# Patient Record
Sex: Male | Born: 1990 | Race: Black or African American | Hispanic: No | Marital: Single | Smoking: Current every day smoker
Health system: Southern US, Community
[De-identification: ages and names within clinical notes are randomized; demographics above are authoritative.]

## PROBLEM LIST (undated history)

## (undated) ENCOUNTER — Emergency Department (HOSPITAL_COMMUNITY): Admission: EM | Payer: BLUE CROSS/BLUE SHIELD | Source: Home / Self Care

---
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
## 2010-01-23 ENCOUNTER — Emergency Department (HOSPITAL_COMMUNITY)
Admission: EM | Admit: 2010-01-23 | Discharge: 2010-01-24 | Payer: Self-pay | Source: Home / Self Care | Admitting: Emergency Medicine

## 2010-02-26 ENCOUNTER — Emergency Department (HOSPITAL_COMMUNITY)
Admission: EM | Admit: 2010-02-26 | Discharge: 2010-02-26 | Payer: Self-pay | Source: Home / Self Care | Admitting: Family Medicine

## 2010-05-09 LAB — POCT I-STAT, CHEM 8
Glucose, Bld: 84 mg/dL (ref 70–99)
HCT: 46 % (ref 39.0–52.0)
Hemoglobin: 15.6 g/dL (ref 13.0–17.0)
Potassium: 3.8 mEq/L (ref 3.5–5.1)
Sodium: 141 mEq/L (ref 135–145)
TCO2: 27 mmol/L (ref 0–100)

## 2011-11-02 ENCOUNTER — Emergency Department (HOSPITAL_COMMUNITY)
Admission: EM | Admit: 2011-11-02 | Discharge: 2011-11-02 | Disposition: A | Discharge status: Home / Self Care | Payer: Managed Care, Other (non HMO) | Source: Home / Self Care

## 2011-11-02 ENCOUNTER — Encounter (HOSPITAL_COMMUNITY): Payer: Self-pay | Admitting: *Deleted

## 2011-11-02 DIAGNOSIS — L089 Local infection of the skin and subcutaneous tissue, unspecified: Secondary | ICD-10-CM

## 2011-11-02 MED ORDER — CEPHALEXIN 500 MG PO CAPS: 500.0000 mg | ORAL_CAPSULE | Freq: Three times a day (TID) | ORAL | Status: AC

## 2011-11-02 NOTE — ED Provider Notes (Signed)
Medical screening examination/treatment/procedure(s) were performed by non-physician practitioner and as supervising physician I was immediately available for consultation/collaboration.  Leslee Home, M.D.   Reuben Likes, MD 11/02/11 [phone]

## 2011-11-02 NOTE — ED Provider Notes (Signed)
History     CSN: [id number]  Arrival date & time 11/02/11  1716   None     Chief Complaint  Patient presents with  . Abscess    (Consider location/radiation/quality/duration/timing/severity/associated sxs/prior treatment) HPI Comments: Thinks he has a spider bite on shaft of penis. Did not see a spider or insect. Felt it this mid AM.  There is a pinpoint papule or pustule with minimal swelling, total area 3mm.   Patient is a 21 y.o. male presenting with abscess.  Abscess     History reviewed. No pertinent past medical history.  History reviewed. No pertinent past surgical history.  History reviewed. No pertinent family history.  History  Substance Use Topics  . Smoking status: Current Everyday Smoker  . Smokeless tobacco: Not on file  . Alcohol Use: Yes      Review of Systems  Constitutional: Negative.   Respiratory: Negative.   Genitourinary: Negative for dysuria, hematuria, discharge, difficulty urinating, penile pain and testicular pain.  Musculoskeletal: Negative.   Skin: Negative for wound.       Pustule at base of penis.     Allergies  Review of patient's allergies indicates no known allergies.  Home Medications   Current Outpatient Rx  Name Route Sig Dispense Refill  . CEPHALEXIN 500 MG PO CAPS Oral Take 1 capsule (500 mg total) by mouth 3 (three) times daily. 15 capsule 0    BP 118/73  Pulse 51  Temp 98.6 F (37 C) (Oral)  Resp 14  SpO2 100%  Physical Exam  Constitutional: He is oriented to person, place, and time. He appears well-developed and well-nourished.  Pulmonary/Chest: Effort normal.  Genitourinary: Penile tenderness present.  Musculoskeletal: Normal range of motion.  Neurological: He is alert and oriented to person, place, and time. No cranial nerve deficit.  Skin: Skin is warm and dry. No erythema.       Pustule base of wound, similar in appearance to a solitary follicular infection.   Psychiatric: He has a normal mood and  affect.    ED Course  Procedures (including critical care time)  Labs Reviewed - No data to display No results found.   1. Skin pustule       MDM  Reassurance, Keflex 500 tid for 5 d Warm compresses tid.           Hayden Rasmussen, NP 11/02/11 [phone]

## 2011-11-02 NOTE — ED Notes (Signed)
Pt with red spot base of penis onset this am - pt believes a spider bite sore to touch

## 2012-12-24 ENCOUNTER — Emergency Department (INDEPENDENT_AMBULATORY_CARE_PROVIDER_SITE_OTHER)
Admission: EM | Admit: 2012-12-24 | Discharge: 2012-12-24 | Disposition: A | Discharge status: Home / Self Care | Payer: Managed Care, Other (non HMO) | Source: Home / Self Care

## 2012-12-24 ENCOUNTER — Encounter (HOSPITAL_COMMUNITY): Payer: Self-pay | Admitting: Emergency Medicine

## 2012-12-24 DIAGNOSIS — L02215 Cutaneous abscess of perineum: Secondary | ICD-10-CM

## 2012-12-24 DIAGNOSIS — L02219 Cutaneous abscess of trunk, unspecified: Secondary | ICD-10-CM

## 2012-12-24 MED ORDER — SULFAMETHOXAZOLE-TRIMETHOPRIM 800-160 MG PO TABS: 1.0000 | ORAL_TABLET | Freq: Two times a day (BID) | ORAL | Status: DC

## 2012-12-24 MED ORDER — HYDROCODONE-ACETAMINOPHEN 5-325 MG PO TABS: 1.0000 | ORAL_TABLET | ORAL | Status: DC | PRN

## 2012-12-24 NOTE — ED Provider Notes (Signed)
CSN: [id number]     Arrival date & time 12/24/12  0802 History   None    Chief Complaint  Patient presents with  . Lymphadenopathy   (Consider location/radiation/quality/duration/timing/severity/associated sxs/prior Treatment) HPI Comments: C/O swollen lymph node in groin for 1 week. "Started like a pimple and got bigger" Denies urinary or genital sx's associated with the painful lesion.   History reviewed. No pertinent past medical history. History reviewed. No pertinent past surgical history. History reviewed. No pertinent family history. History  Substance Use Topics  . Smoking status: Current Every Day Smoker  . Smokeless tobacco: Not on file  . Alcohol Use: Yes    Review of Systems  Constitutional: Positive for activity change. Negative for fever and fatigue.  Gastrointestinal: Negative.   Genitourinary: Positive for genital sores. Negative for dysuria, urgency, frequency, flank pain, discharge, penile swelling, scrotal swelling, penile pain and testicular pain.  Skin: Positive for wound.  All other systems reviewed and are negative.    Allergies  Review of patient's allergies indicates no known allergies.  Home Medications   Current Outpatient Rx  Name  Route  Sig  Dispense  Refill  . HYDROcodone-acetaminophen (NORCO/VICODIN) 5-325 MG per tablet   Oral   Take 1 tablet by mouth every 4 (four) hours as needed for pain.   15 tablet   0   . sulfamethoxazole-trimethoprim (SEPTRA DS) 800-160 MG per tablet   Oral   Take 1 tablet by mouth 2 (two) times daily. X 7 days   14 tablet   0    There were no vitals taken for this visit. Physical Exam  Nursing note and vitals reviewed. Constitutional: He is oriented to person, place, and time. He appears well-developed and well-nourished. No distress.  Neck: Normal range of motion. Neck supple.  Cardiovascular: Normal rate.   Pulmonary/Chest: Effort normal. No respiratory distress.  Genitourinary: No penile tenderness.   Neurological: He is alert and oriented to person, place, and time. He exhibits normal muscle tone.  Skin: Skin is warm and dry.  Elongated painful, tender fluctuant abscess along the right side of the perineum but no involvement of the scrotum or rectum.   Psychiatric: He has a normal mood and affect.    ED Course  INCISION AND DRAINAGE Date/Time: 12/24/2012 8:45 AM Performed by: Phineas Real, [place] Kestner Authorized by: Phineas Real, Taquisha Phung Consent: Verbal consent obtained. Risks and benefits: risks, benefits and alternatives were discussed Consent given by: patient Patient understanding: patient states understanding of the procedure being performed Patient identity confirmed: verbally with patient Type: abscess Body area: anogenital Location details: perineum Anesthesia: local infiltration Local anesthetic: lidocaine 2% with epinephrine Anesthetic total: 4 ml Patient sedated: no Scalpel size: 11 Incision type: single straight Complexity: simple Drainage: purulent Drainage amount: copious Wound treatment: drain placed Packing material: 1/4 in gauze Patient tolerance: Patient tolerated the procedure well with no immediate complications.   (including critical care time) Labs Review Labs Reviewed  CULTURE, ROUTINE-ABSCESS   Imaging Review No results found.    MDM   1. Abscess of perineum      Warm compresses Norco 5 q 4h prn pain #15 Septra DS bid X 7 days. Culture obtained  Hayden Rasmussen, NP 12/24/12 [phone]

## 2012-12-24 NOTE — ED Notes (Signed)
Pt    Reports      Swelling of  Lymph  Node  In  Groin  Area      He  Reports   That   The  Symptoms  Have  Been there  Since last   Week      He reports  Pain  / tenderness in the  Affected  Area           He   denys any       Discharge  Or  Any penile  Lesions  He ambulated  To room  With  A  Steady  Fluid  Gait

## 2012-12-26 ENCOUNTER — Encounter (HOSPITAL_COMMUNITY): Payer: Self-pay | Admitting: Emergency Medicine

## 2012-12-26 ENCOUNTER — Emergency Department (INDEPENDENT_AMBULATORY_CARE_PROVIDER_SITE_OTHER)
Admission: EM | Admit: 2012-12-26 | Discharge: 2012-12-26 | Disposition: A | Discharge status: Home / Self Care | Payer: Managed Care, Other (non HMO) | Source: Home / Self Care | Attending: Family Medicine | Admitting: Family Medicine

## 2012-12-26 DIAGNOSIS — Z4800 Encounter for change or removal of nonsurgical wound dressing: Secondary | ICD-10-CM

## 2012-12-26 MED ORDER — KETOROLAC TROMETHAMINE 10 MG PO TABS: 10.0000 mg | ORAL_TABLET | Freq: Four times a day (QID) | ORAL | Status: DC | PRN

## 2012-12-26 NOTE — ED Notes (Signed)
Presents for f/u of perineal abscess; had I&D with packing done 10/30.  Has been taking Septra DS as directed.  States the "pain pill isn't working so I need something stronger".

## 2012-12-26 NOTE — ED Provider Notes (Signed)
CSN: [id number]     Arrival date & time 12/26/12  1614 History   First MD Initiated Contact with Patient 12/26/12 1650     Chief Complaint  Patient presents with  . Follow-up   (Consider location/radiation/quality/duration/timing/severity/associated sxs/prior Treatment) Patient is a 22 y.o. male presenting with wound check. The history is provided by the patient.  Wound Check This is a new problem. The current episode started 2 days ago. The problem has been gradually improving. Associated symptoms comments: Perineal abscess i+d on 10/30, here for recheck, still with pain, is taking abx..    History reviewed. No pertinent past medical history. History reviewed. No pertinent past surgical history. No family history on file. History  Substance Use Topics  . Smoking status: Current Every Day Smoker  . Smokeless tobacco: Not on file  . Alcohol Use: Yes    Review of Systems  Skin: Positive for wound.    Allergies  Review of patient's allergies indicates no known allergies.  Home Medications   Current Outpatient Rx  Name  Route  Sig  Dispense  Refill  . HYDROcodone-acetaminophen (NORCO/VICODIN) 5-325 MG per tablet   Oral   Take 1 tablet by mouth every 4 (four) hours as needed for pain.   15 tablet   0   . sulfamethoxazole-trimethoprim (SEPTRA DS) 800-160 MG per tablet   Oral   Take 1 tablet by mouth 2 (two) times daily. X 7 days   14 tablet   0   . ketorolac (TORADOL) 10 MG tablet   Oral   Take 1 tablet (10 mg total) by mouth every 6 (six) hours as needed for pain.   20 tablet   0    BP 118/71  Pulse 59  Temp(Src) 97.3 F (36.3 C) (Oral)  Resp 16  SpO2 98% Physical Exam  Nursing note and vitals reviewed. Constitutional: He is oriented to person, place, and time. He appears well-developed and well-nourished.  Neurological: He is alert and oriented to person, place, and time.  Skin: Skin is warm and dry.  Perineal abscess packing removed, no drainage, min  tender , no adenopathy.    ED Course  Procedures (including critical care time) Labs Review Labs Reviewed - No data to display Imaging Review No results found.    MDM      Linna Hoff, MD 12/26/12 [phone]

## 2012-12-27 LAB — CULTURE, ROUTINE-ABSCESS

## 2012-12-27 NOTE — ED Provider Notes (Signed)
Medical screening examination/treatment/procedure(s) were performed by a resident physician or non-physician practitioner and as the supervising physician I was immediately available for consultation/collaboration.  Joleah Kosak, MD    Trenesha Alcaide S Donnica Jarnagin, MD 12/27/12 0858 

## 2012-12-29 NOTE — ED Notes (Addendum)
Abscess culture Perineum: Rare bacillus species.  Pt. had I and D done.  Message sent to Hayden Rasmussen NP and Dr. Denyse Amass. Jordan Blanchard 12/29/2012 Hayden Rasmussen NP,  Rrsponded no further action needed. 12/30/2012

## 2013-02-20 ENCOUNTER — Encounter (HOSPITAL_COMMUNITY): Payer: Self-pay | Admitting: Emergency Medicine

## 2013-02-20 ENCOUNTER — Emergency Department (INDEPENDENT_AMBULATORY_CARE_PROVIDER_SITE_OTHER)
Admission: EM | Admit: 2013-02-20 | Discharge: 2013-02-20 | Disposition: A | Discharge status: Home / Self Care | Payer: Managed Care, Other (non HMO) | Source: Home / Self Care | Attending: Emergency Medicine | Admitting: Emergency Medicine

## 2013-02-20 DIAGNOSIS — R030 Elevated blood-pressure reading, without diagnosis of hypertension: Secondary | ICD-10-CM

## 2013-02-20 DIAGNOSIS — L02214 Cutaneous abscess of groin: Secondary | ICD-10-CM

## 2013-02-20 DIAGNOSIS — L02219 Cutaneous abscess of trunk, unspecified: Secondary | ICD-10-CM

## 2013-02-20 NOTE — ED Provider Notes (Signed)
CSN: [id number]     Arrival date & time 02/20/13  0932 History   First MD Initiated Contact with Patient 02/20/13 1037     Chief Complaint  Patient presents with  . Abscess   (Consider location/radiation/quality/duration/timing/severity/associated sxs/prior Treatment) Patient is a 22 y.o. male presenting with abscess. The history is provided by the patient.  Abscess Location:  Ano-genital Ano-genital abscess location:  Groin Abscess quality: painful   Red streaking: no   Duration:  5 days Progression:  Worsening Pain details:    Quality:  Aching   Severity:  Mild   Duration:  4 days Context: not diabetes and not immunosuppression   Risk factors: prior abscess   Risk factors: no hx of MRSA     History reviewed. No pertinent past medical history. History reviewed. No pertinent past surgical history. No family history on file. History  Substance Use Topics  . Smoking status: Current Every Day Smoker  . Smokeless tobacco: Not on file  . Alcohol Use: Yes    Review of Systems  All other systems reviewed and are negative.    Allergies  Review of patient's allergies indicates no known allergies.  Home Medications   Current Outpatient Rx  Name  Route  Sig  Dispense  Refill  . HYDROcodone-acetaminophen (NORCO/VICODIN) 5-325 MG per tablet   Oral   Take 1 tablet by mouth every 4 (four) hours as needed for pain.   15 tablet   0   . ketorolac (TORADOL) 10 MG tablet   Oral   Take 1 tablet (10 mg total) by mouth every 6 (six) hours as needed for pain.   20 tablet   0   . sulfamethoxazole-trimethoprim (SEPTRA DS) 800-160 MG per tablet   Oral   Take 1 tablet by mouth 2 (two) times daily. X 7 days   14 tablet   0    BP 153/83  Pulse 76  Temp(Src) 98.8 F (37.1 C) (Oral)  Resp 18  SpO2 100% Physical Exam  Vitals reviewed. Constitutional: He is oriented to person, place, and time. He appears well-developed and well-nourished. No distress.  HENT:  Head:  Normocephalic and atraumatic.  Cardiovascular: Normal rate.   Pulmonary/Chest: Effort normal.  Abdominal: There is no tenderness.  Genitourinary: Testes normal and penis normal. Circumcised. No penile tenderness.  3x3cm fluctuant abscess underneath scrotum in right groin fold (perineum) area.  Musculoskeletal: Normal range of motion.  Neurological: He is alert and oriented to person, place, and time.  Skin: Skin is warm and dry.  Psychiatric: He has a normal mood and affect. His behavior is normal.    ED Course  INCISION AND DRAINAGE Date/Time: 02/20/2013 11:16 AM Performed by: Lemmie Evens Vandruff Authorized by: Leslee Home C Consent: Verbal consent obtained. Risks and benefits: risks, benefits and alternatives were discussed Consent given by: patient Patient understanding: patient states understanding of the procedure being performed Patient consent: the patient's understanding of the procedure matches consent given Patient identity confirmed: verbally with patient Time out: Immediately prior to procedure a "time out" was called to verify the correct patient, procedure, equipment, support staff and site/side marked as required. Type: abscess Body area: anogenital Location details: perineum Local anesthetic: topical anesthetic Patient sedated: no Scalpel size: 11 Incision type: single straight Complexity: simple Drainage: purulent Drainage amount: moderate Wound treatment: wound left open Packing material: none Patient tolerance: Patient tolerated the procedure well with no immediate complications.   (including critical care time) Labs Review Labs Reviewed - No data  to display Imaging Review No results found.  EKG Interpretation    Date/Time:    Ventricular Rate:    PR Interval:    QRS Duration:   QT Interval:    QTC Calculation:   R Axis:     Text Interpretation:              MDM   I&D performed. Counseled patient about wound care at home.     Jordan Blanchard, [place] 02/20/13 1118

## 2013-02-20 NOTE — ED Notes (Signed)
Pt c/o perineal abscess onset 3 days... Reports he was seen here on 12/24/12 for the same reason He was given Septra; finished complete course and tolerated well... sxs today include: swelling and pain w/activity Denies: drainage, f/v/n/d He is alert w/no signs of acute distress.

## 2013-02-20 NOTE — ED Provider Notes (Signed)
Medical screening examination/treatment/procedure(s) were performed by non-physician practitioner and as supervising physician I was immediately available for consultation/collaboration.  Leslee Home, M.D.  Reuben Likes, MD 02/20/13 [phone]

## 2013-09-05 ENCOUNTER — Encounter (HOSPITAL_COMMUNITY): Payer: Self-pay | Admitting: Emergency Medicine

## 2013-09-05 ENCOUNTER — Emergency Department (HOSPITAL_COMMUNITY)
Admission: EM | Admit: 2013-09-05 | Discharge: 2013-09-05 | Disposition: A | Discharge status: Home / Self Care | Payer: Managed Care, Other (non HMO) | Attending: Emergency Medicine | Admitting: Emergency Medicine

## 2013-09-05 DIAGNOSIS — K089 Disorder of teeth and supporting structures, unspecified: Secondary | ICD-10-CM | POA: Insufficient documentation

## 2013-09-05 DIAGNOSIS — M2689 Other dentofacial anomalies: Secondary | ICD-10-CM | POA: Insufficient documentation

## 2013-09-05 DIAGNOSIS — Z79899 Other long term (current) drug therapy: Secondary | ICD-10-CM | POA: Insufficient documentation

## 2013-09-05 DIAGNOSIS — K011 Impacted teeth: Secondary | ICD-10-CM

## 2013-09-05 DIAGNOSIS — K0889 Other specified disorders of teeth and supporting structures: Secondary | ICD-10-CM

## 2013-09-05 DIAGNOSIS — F17200 Nicotine dependence, unspecified, uncomplicated: Secondary | ICD-10-CM | POA: Insufficient documentation

## 2013-09-05 MED ORDER — AMOXICILLIN 500 MG PO CAPS: 500.0000 mg | ORAL_CAPSULE | Freq: Once | ORAL | Status: DC

## 2013-09-05 MED ORDER — HYDROCODONE-ACETAMINOPHEN 5-325 MG PO TABS: 1.0000 | ORAL_TABLET | ORAL | Status: DC | PRN

## 2013-09-05 MED ORDER — AMOXICILLIN 500 MG PO CAPS: 500.0000 mg | ORAL_CAPSULE | Freq: Three times a day (TID) | ORAL | Status: DC

## 2013-09-05 MED ORDER — HYDROCODONE-ACETAMINOPHEN 5-325 MG PO TABS: 1.0000 | ORAL_TABLET | Freq: Once | ORAL | Status: DC

## 2013-09-05 NOTE — ED Notes (Signed)
Patient here with complaint of left lower wisdom tooth gingiva swelling and pain. States that it started yesterday and has gradually gotten worse. Presents tonight because pain is worse and a friend told him "those things can kill you".

## 2013-09-05 NOTE — ED Provider Notes (Signed)
CSN: [id number]     Arrival date & time 09/05/13  2251 History   First MD Initiated Contact with Patient 09/05/13 2305     Chief Complaint  Patient presents with  . Dental Pain   HPI  History provided by the patient. Patient is a 23 year old male who presents with complaints of pain and swelling around his left lower wisdom tooth. He states he is concerned for a dental abscess. States he's had some pain and tenderness to the gums and that was red and bleeding with some small amounts of pus. This is been going on for the past few days and gradually worsening. He is concerned for worsening infection. He denies any associated fever, chills or sweats. Denies any swelling under the tongue. Denies similar symptoms previously. He did take some over-the-counter pain medications which seem to help well for the pain. No other aggravating or alleviating factors. No other associated symptoms.   No past medical history on file. No past surgical history on file. No family history on file. History  Substance Use Topics  . Smoking status: Current Every Day Smoker -- 0.10 packs/day    Types: Cigarettes  . Smokeless tobacco: Not on file  . Alcohol Use: Yes    Review of Systems  Constitutional: Negative for fever, chills and diaphoresis.  HENT: Positive for dental problem.   All other systems reviewed and are negative.     Allergies  Review of patient's allergies indicates no known allergies.  Home Medications   Prior to Admission medications   Medication Sig Start Date End Date Taking? Authorizing Provider  HYDROcodone-acetaminophen (NORCO/VICODIN) 5-325 MG per tablet Take 1 tablet by mouth every 4 (four) hours as needed for pain. 12/24/12   Hayden Rasmussenavid Mabe, NP  ketorolac (TORADOL) 10 MG tablet Take 1 tablet (10 mg total) by mouth every 6 (six) hours as needed for pain. 12/26/12   Linna HoffJames D Kindl, MD  sulfamethoxazole-trimethoprim (SEPTRA DS) 800-160 MG per tablet Take 1 tablet by mouth 2 (two) times  daily. X 7 days 12/24/12   Hayden Rasmussenavid Mabe, NP   BP 136/74  Pulse 64  Temp(Src) 98 F (36.7 C) (Oral)  Resp 20  Ht 5\' 11"  (1.803 m)  Wt 165 lb (74.844 kg)  BMI 23.02 kg/m2  SpO2 97% Physical Exam  Nursing note and vitals reviewed. Constitutional: He is oriented to person, place, and time. He appears well-developed and well-nourished.  HENT:  Head: Normocephalic and atraumatic.  Mouth/Throat:    Partially impacted left lower third molar. There is redness and swelling of the gingiva around the tooth. No bleeding or discharge.  Neck: Normal range of motion. Neck supple.  Cardiovascular: Normal rate and regular rhythm.   Pulmonary/Chest: Effort normal and breath sounds normal. No respiratory distress. He has no wheezes.  Abdominal: Soft.  Lymphadenopathy:    He has no cervical adenopathy.  Neurological: He is alert and oriented to person, place, and time.  Skin: Skin is warm.  Psychiatric: He has a normal mood and affect. His behavior is normal.    ED Course  Procedures   COORDINATION OF CARE:  Nursing notes reviewed. Vital signs reviewed. Initial pt interview and examination performed.   Filed Vitals:   09/05/13 2258 09/05/13 2324  BP: 136/74 114/68  Pulse: 64 64  Temp: 98 F (36.7 C) 98 F (36.7 C)  TempSrc: Oral Oral  Resp: 20 18  Height: 5\' 11"  (1.803 m)   Weight: 165 lb (74.844 kg)   SpO2: 97% 99%  11:31 PM-patient seen and evaluated. There is some inflammation and tenderness around the gingiva of the left lower third molar with impaction. No other swelling or pain of the tongue. No sign concerning for Ludwig angina. At this time will provide dental referral. We'll give antibiotics and pain medicine. Patient agrees with plan.      MDM   Final diagnoses:  Pain, dental  Impacted third molar tooth       Angus Seller, PA-C 09/05/13 2332

## 2013-09-05 NOTE — Discharge Instructions (Signed)
Please follow up with a dentist.   Dental Pain A tooth ache may be caused by cavities (tooth decay). Cavities expose the nerve of the tooth to air and hot or cold temperatures. It may come from an infection or abscess (also called a boil or furuncle) around your tooth. It is also often caused by dental caries (tooth decay). This causes the pain you are having. DIAGNOSIS  Your caregiver can diagnose this problem by exam. TREATMENT   If caused by an infection, it may be treated with medications which kill germs (antibiotics) and pain medications as prescribed by your caregiver. Take medications as directed.  Only take over-the-counter or prescription medicines for pain, discomfort, or fever as directed by your caregiver.  Whether the tooth ache today is caused by infection or dental disease, you should see your dentist as soon as possible for further care. SEEK MEDICAL CARE IF: The exam and treatment you received today has been provided on an emergency basis only. This is not a substitute for complete medical or dental care. If your problem worsens or new problems (symptoms) appear, and you are unable to meet with your dentist, call or return to this location. SEEK IMMEDIATE MEDICAL CARE IF:   You have a fever.  You develop redness and swelling of your face, jaw, or neck.  You are unable to open your mouth.  You have severe pain uncontrolled by pain medicine. MAKE SURE YOU:   Understand these instructions.  Will watch your condition.  Will get help right away if you are not doing well or get worse. Document Released: 02/11/2005 Document Revised: 05/06/2011 Document Reviewed: 09/30/2007 [facility] Patient Information 2015 [place]. This information is not intended to replace advice given to you by your health care provider. Make sure you discuss any questions you have with your health care provider.    Impacted Molar Molars are the teeth in the back of your mouth. You have 12  molars. There are 6 molars in each jaw, 3 on each side. When they grow in (erupt) they sometimes cause problems. Molars trapped inside the gum are impacted molars. Impacted molars may grow sideways, tilted, or may only partially emerge. Molars erupt at different times in life. The first set of molars usually erupts around 626 to 23 years of age. The second set of molars typically erupts around 1211 to 23 years of age. The third set of molars are called wisdom teeth. These molars usually do not have enough space to erupt properly. Many teens and young adults develop impacted wisdom teeth and have them surgically removed (extracted). However, any molar or set of molars may become impacted. CAUSES  Teeth that are crowded are often the reason for an impacted molar, but sometimes a cyst or tumor may cause impaction of molars. SYMPTOMS  Sometimes there are no symptoms and an impacted molar is noticed during an exam or X-ray. If there are symptoms they may include:  Pain.  Swelling, redness, or inflammation near the impacted tooth or teeth.  Stiff jaw.  General feeling of illness.  Bad breath.  Gap between the teeth.  Difficulty opening your mouth.  Headache or jaw ache.  Swollen lymph nodes. Impacted teeth may increase the risk of complications such as:  Infection, with possible drainage around the infected area.  Damage to nearby teeth.  Growth of cysts.  Chronic discomfort. DIAGNOSIS  Impacted molars are diagnosed by oral exam and X-rays. TREATMENT  The goal of treatment is to obtain the  best possible arrangement of your teeth. Your dentist or orthodontist will recommend the best course of action for you. After an exam, your caregiver may recommend one or a combination of the following treatments.  Supportive home care to manage pain and other symptoms until treatment can be started.  Surgical extraction of one or a combination of molars to leave room for emerging or later molars.  Teeth must be extracted at appropriate times for the best results.  Surgical uncovering of tissue covering the impacted molar.  Orthodontic repositioning with the use of appliances such as elastic or metal separators, braces, wires, springs, and other removable or fixed devices. This is done to guide the molar and surrounding teeth to grow in properly. In some cases, you may need some surgery to assist this procedure. Follow-up orthodontic treatment is often necessary with impacted first and second molars.  Antibiotics to treat infection. HOME CARE INSTRUCTIONS Rinse as directed with an antibacterial solution or salt and warm water. Follow up with your caregiver as directed, even if you do not have symptoms. If you are waiting for treatment and have pain:  Take pain medicines as directed.  Take your antibiotics as directed. Finish them even if you start to feel better.  Put ice on the affected area.  Put ice in a plastic bag.  Place a towel between your skin and the bag.  Leave the ice on for 15-20 minutes, 03-04 times a day. SEEK DENTAL CARE IF:  You have a fever.  Pain emerges, worsens, or is not controlled by the medicines you were given.  Swelling occurs.  You have difficulty opening your mouth or swallowing. MAKE SURE YOU:   Understand these instructions.  Will watch your condition.  Will get help right away if you are not doing well or get worse. Document Released: 10/10/2010 Document Revised: 05/06/2011 Document Reviewed: 10/10/2010 [facility] Patient Information 2015 [place], [place]. This information is not intended to replace advice given to you by your health care provider. Make sure you discuss any questions you have with your health care provider.

## 2013-09-06 NOTE — ED Provider Notes (Signed)
Medical screening examination/treatment/procedure(s) were performed by non-physician practitioner and as supervising physician I was immediately available for consultation/collaboration.    Nickia Boesen, MD 09/06/13 0700 

## 2013-09-22 ENCOUNTER — Ambulatory Visit (HOSPITAL_COMMUNITY)
Admission: RE | Admit: 2013-09-22 | Discharge: 2013-09-22 | Disposition: A | Discharge status: Home / Self Care | Payer: Managed Care, Other (non HMO) | Source: Ambulatory Visit | Attending: Chiropractic Medicine | Admitting: Chiropractic Medicine

## 2013-09-22 ENCOUNTER — Other Ambulatory Visit (HOSPITAL_COMMUNITY): Payer: Self-pay | Admitting: Chiropractic Medicine

## 2013-09-22 DIAGNOSIS — R52 Pain, unspecified: Secondary | ICD-10-CM

## 2013-09-22 DIAGNOSIS — M549 Dorsalgia, unspecified: Secondary | ICD-10-CM | POA: Insufficient documentation

## 2014-01-18 ENCOUNTER — Encounter (HOSPITAL_COMMUNITY): Payer: Self-pay

## 2014-01-18 ENCOUNTER — Emergency Department (HOSPITAL_COMMUNITY)
Admission: EM | Admit: 2014-01-18 | Discharge: 2014-01-18 | Disposition: A | Discharge status: Home / Self Care | Payer: Managed Care, Other (non HMO) | Attending: Emergency Medicine | Admitting: Emergency Medicine

## 2014-01-18 DIAGNOSIS — Z792 Long term (current) use of antibiotics: Secondary | ICD-10-CM | POA: Diagnosis not present

## 2014-01-18 DIAGNOSIS — Z79899 Other long term (current) drug therapy: Secondary | ICD-10-CM | POA: Insufficient documentation

## 2014-01-18 DIAGNOSIS — L02215 Cutaneous abscess of perineum: Secondary | ICD-10-CM | POA: Diagnosis not present

## 2014-01-18 DIAGNOSIS — Z720 Tobacco use: Secondary | ICD-10-CM | POA: Diagnosis not present

## 2014-01-18 MED ORDER — CLINDAMYCIN HCL 150 MG PO CAPS: 450.0000 mg | ORAL_CAPSULE | Freq: Three times a day (TID) | ORAL | Status: DC

## 2014-01-18 MED ORDER — CLINDAMYCIN HCL 150 MG PO CAPS
600.0000 mg | ORAL_CAPSULE | Freq: Once | ORAL | Status: AC
  Administered 2014-01-18: 600 mg via ORAL
  Filled 2014-01-18: qty 4

## 2014-01-18 NOTE — Discharge Instructions (Signed)
1. Medications: clindamycin, usual home medications 2. Treatment: rest, drink plenty of fluids, warm water soaks 3. Follow Up: Please followup with your primary doctor in 1 day for discussion of your diagnoses and wound check; if you do not have a primary care doctor use the resource guide provided to find one; Please return to the ER for painful defecation, worsening pain, fevers or chills    Abscess An abscess is an infected area that contains a collection of pus and debris.It can occur in almost any part of the body. An abscess is also known as a furuncle or boil. CAUSES  An abscess occurs when tissue gets infected. This can occur from blockage of oil or sweat glands, infection of hair follicles, or a minor injury to the skin. As the body tries to fight the infection, pus collects in the area and creates pressure under the skin. This pressure causes pain. People with weakened immune systems have difficulty fighting infections and get certain abscesses more often.  SYMPTOMS Usually an abscess develops on the skin and becomes a painful mass that is red, warm, and tender. If the abscess forms under the skin, you may feel a moveable soft area under the skin. Some abscesses break open (rupture) on their own, but most will continue to get worse without care. The infection can spread deeper into the body and eventually into the bloodstream, causing you to feel ill.  DIAGNOSIS  Your caregiver will take your medical history and perform a physical exam. A sample of fluid may also be taken from the abscess to determine what is causing your infection. TREATMENT  Your caregiver may prescribe antibiotic medicines to fight the infection. However, taking antibiotics alone usually does not cure an abscess. Your caregiver may need to make a small cut (incision) in the abscess to drain the pus. In some cases, gauze is packed into the abscess to reduce pain and to continue draining the area. HOME CARE INSTRUCTIONS     Only take over-the-counter or prescription medicines for pain, discomfort, or fever as directed by your caregiver.  If you were prescribed antibiotics, take them as directed. Finish them even if you start to feel better.  If gauze is used, follow your caregiver's directions for changing the gauze.  To avoid spreading the infection:  Keep your draining abscess covered with a bandage.  Wash your hands well.  Do not share personal care items, towels, or whirlpools with others.  Avoid skin contact with others.  Keep your skin and clothes clean around the abscess.  Keep all follow-up appointments as directed by your caregiver. SEEK MEDICAL CARE IF:   You have increased pain, swelling, redness, fluid drainage, or bleeding.  You have muscle aches, chills, or a general ill feeling.  You have a fever. MAKE SURE YOU:   Understand these instructions.  Will watch your condition.  Will get help right away if you are not doing well or get worse. Document Released: 11/21/2004 Document Revised: 08/13/2011 Document Reviewed: 04/26/2011 [facility] Patient Information 2015 [place], [place]. This information is not intended to replace advice given to you by your health care provider. Make sure you discuss any questions you have with your health care provider.

## 2014-01-18 NOTE — ED Notes (Signed)
Pt. Reports having an abscess on his rt. Groin started last week, and it opened and began to drain.  He reports that he believes that he has a swollen lymp node.   Pt. Denies any testicle swelling or penile drainage.

## 2014-01-18 NOTE — ED Provider Notes (Signed)
CSN: [id number]     Arrival date & time 01/18/14  1524 History  This chart was scribed for non-physician practitioner, Dierdre ForthHannah Avarey Yaeger, PA-C, working with Elwin MochaBlair Walden, MD, by Bronson CurbJacqueline Melvin, ED Scribe. This patient was seen in room TR10C/TR10C and the patient's care was started at 4:23 PM.   Chief Complaint  Patient presents with  . Abscess    The history is provided by the patient and medical records. No language interpreter was used.     HPI Comments: Jordan Blanchard is a 23 y.o. male who presents to the Emergency Department complaining of possible abscess to his right groin that appeared last week. Patient reports history of 3 prior abscesses in the same area, and states this current abscess has opened and begun to drain. He also suspects a swollen lymph node in the same area. He notes the abscess has improved since draining. Patient denies fever, chills, nausea, vomiting, penile discharge, penile pain, testicular pain, or pain with BM. He denies history of HIV, steroid use, or IV drug use.  History reviewed. No pertinent past medical history. History reviewed. No pertinent past surgical history. No family history on file. History  Substance Use Topics  . Smoking status: Current Every Day Smoker -- 0.10 packs/day    Types: Cigarettes  . Smokeless tobacco: Not on file  . Alcohol Use: Yes    Review of Systems  Constitutional: Negative for fever and chills.  Gastrointestinal: Negative for nausea and vomiting.  Genitourinary: Negative for discharge, penile pain and testicular pain.  Skin: Positive for rash and wound (abscess).  Allergic/Immunologic: Negative for immunocompromised state.  Hematological: Does not bruise/bleed easily.  Psychiatric/Behavioral: The patient is not nervous/anxious.       Allergies  Review of patient's allergies indicates no known allergies.  Home Medications   Prior to Admission medications   Medication Sig Start Date End Date Taking?  Authorizing Provider  amoxicillin (AMOXIL) 500 MG capsule Take 1 capsule (500 mg total) by mouth 3 (three) times daily. 09/05/13   Phill MutterPeter S Dammen, PA-C  clindamycin (CLEOCIN) 150 MG capsule Take 3 capsules (450 mg total) by mouth 3 (three) times daily. 01/18/14   Sayde Lish, PA-C  HYDROcodone-acetaminophen (NORCO/VICODIN) 5-325 MG per tablet Take 1 tablet by mouth every 4 (four) hours as needed for pain. 12/24/12   Hayden Rasmussenavid Mabe, NP  HYDROcodone-acetaminophen (NORCO/VICODIN) 5-325 MG per tablet Take 1-2 tablets by mouth every 4 (four) hours as needed for moderate pain. 09/05/13   Phill MutterPeter S Dammen, PA-C  ketorolac (TORADOL) 10 MG tablet Take 1 tablet (10 mg total) by mouth every 6 (six) hours as needed for pain. 12/26/12   Linna HoffJames D Kindl, MD  sulfamethoxazole-trimethoprim (SEPTRA DS) 800-160 MG per tablet Take 1 tablet by mouth 2 (two) times daily. X 7 days 12/24/12   Hayden Rasmussenavid Mabe, NP   Triage Vitals: BP 135/71 mmHg  Pulse 108  Temp(Src) 97.6 F (36.4 C)  Resp 16  Ht 6' (1.829 m)  Wt 160 lb (72.576 kg)  BMI 21.70 kg/m2  SpO2 100%  Physical Exam  Constitutional: He is oriented to person, place, and time. He appears well-developed and well-nourished. No distress.  HENT:  Head: Normocephalic and atraumatic.  Eyes: Conjunctivae are normal. No scleral icterus.  Neck: Normal range of motion.  Cardiovascular: Normal rate, regular rhythm, normal heart sounds and intact distal pulses.   No murmur heard. Pulmonary/Chest: Effort normal and breath sounds normal.  Abdominal: Soft. He exhibits no distension. There is no tenderness. Hernia confirmed  negative in the right inguinal area and confirmed negative in the left inguinal area.  Genitourinary: Testes normal and penis normal. Cremasteric reflex is present. Right testis shows no mass, no swelling and no tenderness. Right testis is descended. Cremasteric reflex is not absent on the right side. Left testis shows no mass, no swelling and no tenderness.  Left testis is descended. Cremasteric reflex is not absent on the left side. Circumcised. No phimosis, paraphimosis, hypospadias, penile erythema or penile tenderness. No discharge found.  Mild lymphadenopathy to the right inguinal area Superficial abscess to the perineum; base of the abscess visualized with bedside ultrasound  Lymphadenopathy:    He has no cervical adenopathy.       Right: Inguinal adenopathy present.       Left: No inguinal adenopathy present.  Neurological: He is alert and oriented to person, place, and time.  Skin: Skin is warm and dry. He is not diaphoretic. There is erythema.  Psychiatric: He has a normal mood and affect.  Nursing note and vitals reviewed.   ED Course  INCISION AND DRAINAGE Date/Time: 01/18/2014 5:03 PM Performed by: Dierdre ForthMUTHERSBAUGH, Kache Mcclurg Authorized by: Dierdre ForthMUTHERSBAUGH, Esther Bradstreet Consent: Verbal consent obtained. Risks and benefits: risks, benefits and alternatives were discussed Consent given by: patient Patient understanding: patient states understanding of the procedure being performed Patient consent: the patient's understanding of the procedure matches consent given Procedure consent: procedure consent matches procedure scheduled Relevant documents: relevant documents present and verified Test results: test results available and properly labeled Imaging studies: imaging studies available (bedside US) Required items: required blood products, implants, devices, and special equipment available Patient identity confirmed: verbally with patient and arm band Time out: Immediately prior to procedure a "time out" was called to verify the correct patient, procedure, equipment, support staff and site/side marked as required. Type: abscess Body area: anogenital Location details: perineum Local anesthetic: co-phenylcaine spray Patient sedated: no Scalpel size: 11 Incision type: single straight Complexity: simple Drainage: purulent Drainage amount:  moderate Wound treatment: wound left open Packing material: none Patient tolerance: Patient tolerated the procedure well with no immediate complications   (including critical care time)  DIAGNOSTIC STUDIES: Oxygen Saturation is 100% on room air, normal by my interpretation.    COORDINATION OF CARE: At 1629 Discussed treatment plan with patient which includes I&D. Patient agrees.   EMERGENCY DEPARTMENT US SOFT TISSUE INTERPRETATION "Study: Limited Ultrasound of the noted body part in comments below"  INDICATIONS: Pain Multiple views of the body part are obtained with a multi-frequency linear probe  PERFORMED BY:  Myself  IMAGES ARCHIVED?: Yes  SIDE: Just to the right of Midline  BODY PART: Perineum  FINDINGS: Abcess present  LIMITATIONS:  Body Habitus  INTERPRETATION:  Abcess present and No cellulitis noted  COMMENT:  Small abscess noted on the perineum just to the right of midline; base of the abscess visualized on ultrasound and no surrounding cellulitis     Labs Review Labs Reviewed - No data to display  Imaging Review No results found.   EKG Interpretation None      MDM   Final diagnoses:  Perineal abscess, superficial   Jordan Blanchard is intraperitoneal abscess. Abscess is small and amenable to incision and drainage. Patient without fevers, chills, nausea or vomiting to suggest systemic infection. Patient without painful defecation, rectal pain or tenderness to palpation of other portions of the peritoneal. No tenderness to palpation of the scrotum or penis. No dysuria or hematuria.  Patient with skin abscess amenable to incision and  drainage.  Abscess was not large enough to warrant packing or drain,  wound recheck in 1 days. Encouraged home warm soaks and flushing.  No signs of cellulitis is surrounding skin.  Will d/c to home with clindamycin  I have personally reviewed patient's vitals, nursing note and any pertinent labs or imaging.  I performed an  undressed physical exam.    It has been determined that no acute conditions requiring further emergency intervention are present at this time. The patient/guardian have been advised of the diagnosis and plan. I reviewed all labs and imaging including any potential incidental findings. We have discussed signs and symptoms that warrant return to the ED and they are listed in the discharge instructions.    Vital signs are stable at discharge. Patient with tachycardia at triage but no tachycardia on my physical exam.  BP 132/70 mmHg  Pulse 60  Temp(Src) 98.4 F (36.9 C)  Resp 18  Ht 6' (1.829 m)  Wt 160 lb (72.576 kg)  BMI 21.70 kg/m2  SpO2 100%   I personally performed the services described in this documentation, which was scribed in my presence. The recorded information has been reviewed and is accurate.    Dahlia Client Nieve Rojero, PA-C 01/18/14 1706  Elwin Mocha, MD 01/18/14 [phone]

## 2014-01-25 ENCOUNTER — Emergency Department (HOSPITAL_COMMUNITY)
Admission: EM | Admit: 2014-01-25 | Discharge: 2014-01-25 | Disposition: A | Discharge status: Home / Self Care | Payer: No Typology Code available for payment source

## 2014-01-25 NOTE — ED Notes (Signed)
Pt called for triage no response ?

## 2014-01-25 NOTE — ED Notes (Signed)
Pt called from triage with no response. 

## 2014-04-18 ENCOUNTER — Emergency Department (HOSPITAL_COMMUNITY)
Admission: EM | Admit: 2014-04-18 | Discharge: 2014-04-18 | Disposition: A | Discharge status: Home / Self Care | Payer: BLUE CROSS/BLUE SHIELD | Source: Home / Self Care | Attending: Emergency Medicine | Admitting: Emergency Medicine

## 2014-04-18 ENCOUNTER — Encounter (HOSPITAL_COMMUNITY): Payer: Self-pay | Admitting: Emergency Medicine

## 2014-04-18 DIAGNOSIS — L0291 Cutaneous abscess, unspecified: Secondary | ICD-10-CM

## 2014-04-18 MED ORDER — MUPIROCIN 2 % EX OINT: TOPICAL_OINTMENT | CUTANEOUS | Status: DC

## 2014-04-18 MED ORDER — SULFAMETHOXAZOLE-TRIMETHOPRIM 800-160 MG PO TABS: 2.0000 | ORAL_TABLET | Freq: Two times a day (BID) | ORAL | Status: DC

## 2014-04-18 MED ORDER — HYDROCODONE-ACETAMINOPHEN 5-325 MG PO TABS: ORAL_TABLET | ORAL | Status: DC

## 2014-04-18 MED ORDER — LIDOCAINE-EPINEPHRINE (PF) 2 %-1:200000 IJ SOLN
INTRAMUSCULAR | Status: AC
  Filled 2014-04-18: qty 20

## 2014-04-18 MED ORDER — CHLORHEXIDINE GLUCONATE 4 % EX LIQD: Freq: Every day | CUTANEOUS | Status: DC | PRN

## 2014-04-18 NOTE — ED Provider Notes (Signed)
   Chief Complaint   Abscess   History of Present Illness   Jordan Blanchard is a 24 year old male who's had multiple right groin abscesses in the past. Current episode has been going on for the past 6 days and it drained a small amount of pus but is still very swollen and tender. He denies any fever or chills.  Review of Systems   Other than as noted above, the patient denies any of the following symptoms: Systemic:  No fever, chills or sweats. Skin:  No rash or itching.  PMFSH   Past medical history, family history, social history, meds, and allergies were reviewed.   Physical Examination     Vital signs:  BP 120/73 mmHg  Pulse 59  Temp(Src) 98.1 F (36.7 C) (Oral)  Resp 6  SpO2 99% Skin:  There is a 1.5 cm raised, fluctuant, tender abscess in the right groin area.  There was no crepitus, necrosis, ecchymosis, or herrhagic bullae. Skin exam was otherwise normal.  No rash. Ext:  Distal pulses were full, patient has full ROM of all joints.  Procedure Note:  Verbal informed consent was obtained.  The patient was informed of the risks and benefits of the procedure and understands and accepts.  A time out was called and the identity of the patient and correct procedure was confirmed.   The abscess area described above was prepped with Betadine and alcohol and anesthetized with  Ethyl chloride spray, since the patient declined use of injectable lidocaine.  Using a #11 scalpel blade, a singe straight incision was made into the area of fluctulence, yielding a moderate amount of prurulent drainage.  Routine cultures were obtained.  Blunt dissection was used to break up loculations and the wound cavity was not packed, since the patient declined have this done.  A sterile pressure dressing was applied.    Assessment   The encounter diagnosis was Abscess.  Plan     1.  Meds:  The following meds were prescribed:   Discharge Medication List as of 04/18/2014 11:47 AM    START taking these  medications   Details  chlorhexidine (HIBICLENS) 4 % external liquid Apply topically daily as needed. Use as body wash daily for 1 month, Starting 04/18/2014, Until Discontinued, Normal    !! HYDROcodone-acetaminophen (NORCO/VICODIN) 5-325 MG per tablet 1 to 2 tabs every 4 to 6 hours as needed for pain., Print    mupirocin ointment (BACTROBAN) 2 % Apply to both nostrils TID for 1 month, Normal    !! sulfamethoxazole-trimethoprim (BACTRIM DS,SEPTRA DS) 800-160 MG per tablet Take 2 tablets by mouth 2 (two) times daily., Starting 04/18/2014, Until Discontinued, Normal     !! - Potential duplicate medications found. Please discuss with provider.      2.  Patient Education/Counseling:  The patient was given appropriate handouts, wound care instructions, and instructed in pain control.  He was instructed in a MRSA eradication regimen.  3.  Follow up:  The patient was instructed to return if becoming worse in any way, and given some red flag symptoms such as fever which would prompt immediate return.       Jordan Likesavid C Arvind Mexicano, MD 04/18/14 [phone]

## 2014-04-18 NOTE — ED Notes (Signed)
Pt has an abscess in his right groin.  He states he gets them every couple of months.

## 2014-04-18 NOTE — Discharge Instructions (Signed)
Now that the packing has been removed from your abscess, you may bathe and shower as normal.  You should change the dressing at least once a day.  You may change it more often if it becomes soiled with drainage.  Wash the wound well with soap and water, pat dry, apply an antibiotic ointment (Neosporin, Polysporin, or Bacitracin are all OK), then cover with a non-stick dressing such as Telfa with several layers of plain gause over that to absorb drainage.  You may secure this in place with tape or with roll gauze and tape.  Keep the wound covered for until it stops draining.  This usually takes 7 days, but may be longer.  Return for a recheck if you have heavy bleeding, increasing pain, fever, or the wound looks worse.  ° °

## 2014-04-22 LAB — CULTURE, ROUTINE-ABSCESS

## 2016-03-28 ENCOUNTER — Encounter (HOSPITAL_COMMUNITY): Payer: Self-pay | Admitting: Emergency Medicine

## 2016-03-28 ENCOUNTER — Ambulatory Visit (HOSPITAL_COMMUNITY)
Admission: EM | Admit: 2016-03-28 | Discharge: 2016-03-28 | Disposition: A | Discharge status: Home / Self Care | Payer: BLUE CROSS/BLUE SHIELD | Attending: Family Medicine | Admitting: Family Medicine

## 2016-03-28 DIAGNOSIS — K0889 Other specified disorders of teeth and supporting structures: Secondary | ICD-10-CM

## 2016-03-28 MED ORDER — KETOROLAC TROMETHAMINE 10 MG PO TABS: 10.0000 mg | ORAL_TABLET | Freq: Four times a day (QID) | ORAL | 0 refills | Status: DC | PRN

## 2016-03-28 NOTE — ED Triage Notes (Signed)
Here for dental pain on both sides onset 1 week  Has appt w/dentist next Friday  Taking tyle w/no relief.   A&O x4... NAD

## 2016-03-28 NOTE — Discharge Instructions (Signed)
You have several dental carries and chipped teeth. I do not see any signs of infection at this time. I have sent a prescription to your pharmacy for a medicine called Toradol. Take 1 tablet every 6 hours as needed for pain. I would also encourage you to take Tylenol over the counter every 4-6 hours, do not take more than 4,000mg  of tylenol in any 24 hour period. I would encourage you to keep your dental appointment next Friday as you will need to see a dentist for further care.

## 2016-03-28 NOTE — ED Provider Notes (Signed)
CSN: [id number]     Arrival date & time 03/28/16  1739 History   First MD Initiated Contact with Patient 03/28/16 1805     Chief Complaint  Patient presents with  . Dental Pain   (Consider location/radiation/quality/duration/timing/severity/associated sxs/prior Treatment) 26 year old male presents to clinic with chief complaint of dental pain for 1 week. He states he has several chipped teeth, has an appointment scheduled next Friday with a dentist. He has been taking Tylenol at home with minimal relief. He has had no fever, no swelling, no redness, no systemic symptoms   The history is provided by the patient.  Dental Pain    History reviewed. No pertinent past medical history. History reviewed. No pertinent surgical history. Family History  Problem Relation Age of Onset  . Cancer Maternal Grandmother    Social History  Substance Use Topics  . Smoking status: Current Every Day Smoker    Packs/day: 0.25    Types: Cigarettes  . Smokeless tobacco: Never Used  . Alcohol use Yes    Review of Systems  Reason unable to perform ROS: as covered in HPI.  All other systems reviewed and are negative.   Allergies  Patient has no known allergies.  Home Medications   Prior to Admission medications   Medication Sig Start Date End Date Taking? Authorizing Provider  amoxicillin (AMOXIL) 500 MG capsule Take 1 capsule (500 mg total) by mouth 3 (three) times daily. 09/05/13   Ivonne AndrewPeter Dammen, PA-C  chlorhexidine (HIBICLENS) 4 % external liquid Apply topically daily as needed. Use as body wash daily for 1 month 04/18/14   Reuben Likesavid C Keller, MD  clindamycin (CLEOCIN) 150 MG capsule Take 3 capsules (450 mg total) by mouth 3 (three) times daily. 01/18/14   Hannah Muthersbaugh, PA-C  HYDROcodone-acetaminophen (NORCO/VICODIN) 5-325 MG per tablet Take 1 tablet by mouth every 4 (four) hours as needed for pain. 12/24/12   Hayden Rasmussenavid Mabe, NP  HYDROcodone-acetaminophen (NORCO/VICODIN) 5-325 MG per tablet Take 1-2  tablets by mouth every 4 (four) hours as needed for moderate pain. 09/05/13   Ivonne AndrewPeter Dammen, PA-C  HYDROcodone-acetaminophen (NORCO/VICODIN) 5-325 MG per tablet 1 to 2 tabs every 4 to 6 hours as needed for pain. 04/18/14   Reuben Likesavid C Keller, MD  ketorolac (TORADOL) 10 MG tablet Take 1 tablet (10 mg total) by mouth every 6 (six) hours as needed for pain. 12/26/12   Linna HoffJames D Kindl, MD  ketorolac (TORADOL) 10 MG tablet Take 1 tablet (10 mg total) by mouth every 6 (six) hours as needed. 03/28/16   Dorena BodoLawrence Arron Tetrault, NP  mupirocin ointment (BACTROBAN) 2 % Apply to both nostrils TID for 1 month 04/18/14   Reuben Likesavid C Keller, MD  sulfamethoxazole-trimethoprim (BACTRIM DS,SEPTRA DS) 800-160 MG per tablet Take 2 tablets by mouth 2 (two) times daily. 04/18/14   Reuben Likesavid C Keller, MD  sulfamethoxazole-trimethoprim (SEPTRA DS) 800-160 MG per tablet Take 1 tablet by mouth 2 (two) times daily. X 7 days 12/24/12   Hayden Rasmussenavid Mabe, NP   Meds Ordered and Administered this Visit  Medications - No data to display  BP 121/77 (BP Location: Left Arm)   Pulse (!) 58   Temp 98.1 F (36.7 C) (Oral)   Resp 20   SpO2 100%  No data found.   Physical Exam  Constitutional: He is oriented to person, place, and time. He appears well-developed and well-nourished. No distress.  HENT:  Head: Normocephalic and atraumatic.  Right Ear: External ear normal.  Left Ear: External ear normal.  Nose: Nose normal.  Mouth/Throat: Oropharynx is clear and moist.    Lymphadenopathy:       Head (right side): No submental, no submandibular, no tonsillar, no preauricular and no posterior auricular adenopathy present.       Head (left side): No submental, no submandibular, no tonsillar, no preauricular and no posterior auricular adenopathy present.    He has no cervical adenopathy.  Neurological: He is alert and oriented to person, place, and time.  Skin: Skin is warm and dry. Capillary refill takes less than 2 seconds. He is not diaphoretic.  Psychiatric:  He has a normal mood and affect.  Nursing note and vitals reviewed.   Urgent Care Course     Procedures (including critical care time)  Labs Review Labs Reviewed - No data to display  Imaging Review No results found.   Visual Acuity Review  Right Eye Distance:   Left Eye Distance:   Bilateral Distance:    Right Eye Near:   Left Eye Near:    Bilateral Near:         MDM   1. Pain, dental   You have several dental carries and chipped teeth. I do not see any signs of infection at this time. I have sent a prescription to your pharmacy for a medicine called Toradol. Take 1 tablet every 6 hours as needed for pain. I would also encourage you to take Tylenol over the counter every 4-6 hours, do not take more than 4,000mg  of tylenol in any 24 hour period. I would encourage you to keep your dental appointment next Friday as you will need to see a dentist for further care.      Dorena Bodo, NP 03/28/16 [phone]

## 2016-06-03 ENCOUNTER — Ambulatory Visit (HOSPITAL_COMMUNITY)
Admission: EM | Admit: 2016-06-03 | Discharge: 2016-06-03 | Disposition: A | Discharge status: Home / Self Care | Payer: BLUE CROSS/BLUE SHIELD | Attending: Internal Medicine | Admitting: Internal Medicine

## 2016-06-03 ENCOUNTER — Encounter (HOSPITAL_COMMUNITY): Payer: Self-pay | Admitting: *Deleted

## 2016-06-03 DIAGNOSIS — R319 Hematuria, unspecified: Secondary | ICD-10-CM | POA: Diagnosis present

## 2016-06-03 DIAGNOSIS — N342 Other urethritis: Secondary | ICD-10-CM | POA: Diagnosis not present

## 2016-06-03 DIAGNOSIS — R300 Dysuria: Secondary | ICD-10-CM

## 2016-06-03 DIAGNOSIS — R369 Urethral discharge, unspecified: Secondary | ICD-10-CM | POA: Diagnosis not present

## 2016-06-03 DIAGNOSIS — F17210 Nicotine dependence, cigarettes, uncomplicated: Secondary | ICD-10-CM | POA: Diagnosis not present

## 2016-06-03 DIAGNOSIS — Z711 Person with feared health complaint in whom no diagnosis is made: Secondary | ICD-10-CM

## 2016-06-03 LAB — POCT URINALYSIS DIP (DEVICE)
Bilirubin Urine: NEGATIVE
GLUCOSE, UA: NEGATIVE mg/dL
Hgb urine dipstick: NEGATIVE
Ketones, ur: NEGATIVE mg/dL
Leukocytes, UA: NEGATIVE
Nitrite: NEGATIVE
PH: 6.5 (ref 5.0–8.0)
PROTEIN: NEGATIVE mg/dL
Specific Gravity, Urine: 1.02 (ref 1.005–1.030)
UROBILINOGEN UA: 2 mg/dL — AB (ref 0.0–1.0)

## 2016-06-03 MED ORDER — STERILE WATER FOR INJECTION IJ SOLN
INTRAMUSCULAR | Status: AC
  Filled 2016-06-03: qty 10

## 2016-06-03 MED ORDER — CEFTRIAXONE SODIUM 250 MG IJ SOLR
INTRAMUSCULAR | Status: AC
  Filled 2016-06-03: qty 250

## 2016-06-03 MED ORDER — AZITHROMYCIN 250 MG PO TABS
1000.0000 mg | ORAL_TABLET | Freq: Once | ORAL | Status: AC
  Administered 2016-06-03: 1000 mg via ORAL

## 2016-06-03 MED ORDER — AZITHROMYCIN 250 MG PO TABS
ORAL_TABLET | ORAL | Status: AC
  Filled 2016-06-03: qty 4

## 2016-06-03 MED ORDER — CEFTRIAXONE SODIUM 250 MG IJ SOLR
250.0000 mg | Freq: Once | INTRAMUSCULAR | Status: AC
  Administered 2016-06-03: 250 mg via INTRAMUSCULAR

## 2016-06-03 NOTE — ED Triage Notes (Signed)
Pt  Has  heamaturia   And  Tingling  When  Urinates      Symptoms    Noticed  Today      Pt denies   Any  Sores

## 2016-06-03 NOTE — ED Provider Notes (Signed)
CSN: [id number]     Arrival date & time 06/03/16  1018 History   First MD Initiated Contact with Patient 06/03/16 1122     Chief Complaint  Patient presents with  . Hematuria   (Consider location/radiation/quality/duration/timing/severity/associated sxs/prior Treatment) Patient c/o hematuria and burning with urination.  He has had unprotected sex recently.   The history is provided by the patient.  Penile Discharge  This is a new problem. The current episode started yesterday. The problem occurs constantly. The problem has not changed since onset.Nothing aggravates the symptoms. Nothing relieves the symptoms.    History reviewed. No pertinent past medical history. History reviewed. No pertinent surgical history. Family History  Problem Relation Age of Onset  . Cancer Maternal Grandmother    Social History  Substance Use Topics  . Smoking status: Current Every Day Smoker    Packs/day: 0.25    Types: Cigarettes  . Smokeless tobacco: Never Used  . Alcohol use Yes    Review of Systems  Constitutional: Negative.   HENT: Negative.   Eyes: Negative.   Respiratory: Negative.   Cardiovascular: Negative.   Gastrointestinal: Negative.   Genitourinary: Positive for discharge.  Musculoskeletal: Negative.   Skin: Negative.   Allergic/Immunologic: Negative.   Neurological: Negative.   Hematological: Negative.   Psychiatric/Behavioral: Negative.     Allergies  Patient has no known allergies.  Home Medications   Prior to Admission medications   Medication Sig Start Date End Date Taking? Authorizing Provider  amoxicillin (AMOXIL) 500 MG capsule Take 1 capsule (500 mg total) by mouth 3 (three) times daily. 09/05/13   Ivonne Andrew, PA-C  chlorhexidine (HIBICLENS) 4 % external liquid Apply topically daily as needed. Use as body wash daily for 1 month 04/18/14   Reuben Likes, MD  HYDROcodone-acetaminophen (NORCO/VICODIN) 5-325 MG per tablet Take 1 tablet by mouth every 4 (four) hours  as needed for pain. 12/24/12   Hayden Rasmussen, NP  HYDROcodone-acetaminophen (NORCO/VICODIN) 5-325 MG per tablet Take 1-2 tablets by mouth every 4 (four) hours as needed for moderate pain. 09/05/13   Ivonne Andrew, PA-C  HYDROcodone-acetaminophen (NORCO/VICODIN) 5-325 MG per tablet 1 to 2 tabs every 4 to 6 hours as needed for pain. 04/18/14   Reuben Likes, MD  ketorolac (TORADOL) 10 MG tablet Take 1 tablet (10 mg total) by mouth every 6 (six) hours as needed for pain. 12/26/12   Linna Hoff, MD  ketorolac (TORADOL) 10 MG tablet Take 1 tablet (10 mg total) by mouth every 6 (six) hours as needed. 03/28/16   Dorena Bodo, NP  mupirocin ointment (BACTROBAN) 2 % Apply to both nostrils TID for 1 month 04/18/14   Reuben Likes, MD  sulfamethoxazole-trimethoprim (BACTRIM DS,SEPTRA DS) 800-160 MG per tablet Take 2 tablets by mouth 2 (two) times daily. 04/18/14   Reuben Likes, MD  sulfamethoxazole-trimethoprim (SEPTRA DS) 800-160 MG per tablet Take 1 tablet by mouth 2 (two) times daily. X 7 days 12/24/12   Hayden Rasmussen, NP   Meds Ordered and Administered this Visit   Medications  azithromycin [facility]) tablet 1,000 mg (1,000 mg Oral Given 06/03/16 1151)  cefTRIAXone (ROCEPHIN) injection 250 mg (250 mg Intramuscular Given 06/03/16 1151)    BP (!) 123/59 (BP Location: Right Arm) Comment: rn notified  Pulse 63   Temp 98.6 F (37 C) (Oral)   Resp 16   SpO2 100%  No data found.   Physical Exam  Constitutional: He appears well-developed and well-nourished.  HENT:  Head: Normocephalic  and atraumatic.  Right Ear: External ear normal.  Left Ear: External ear normal.  Mouth/Throat: Oropharynx is clear and moist.  Eyes: Conjunctivae and EOM are normal. Pupils are equal, round, and reactive to light.  Neck: Normal range of motion. Neck supple.  Cardiovascular: Normal rate, regular rhythm and normal heart sounds.   Pulmonary/Chest: Effort normal and breath sounds normal.  Abdominal: Soft. Bowel sounds are  normal.  Genitourinary: Penis normal.  Musculoskeletal: Normal range of motion.  Nursing note and vitals reviewed.   Urgent Care Course     Procedures (including critical care time)  Labs Review Labs Reviewed  POCT URINALYSIS DIP (DEVICE) - Abnormal; Notable for the following:       Result Value   Urobilinogen, UA 2.0 (*)    All other components within normal limits  URINE CYTOLOGY ANCILLARY ONLY    Imaging Review No results found.   Visual Acuity Review  Right Eye Distance:   Left Eye Distance:   Bilateral Distance:    Right Eye Near:   Left Eye Near:    Bilateral Near:         MDM   1. Urethritis   2. Concern about STD in male without diagnosis    Rocephin  IM Azithromycin  x 4   UA cytology - GC chlamydia Clayborne Artist, FNP 06/03/16 1234

## 2016-06-04 LAB — URINE CYTOLOGY ANCILLARY ONLY
Chlamydia: NEGATIVE
Neisseria Gonorrhea: NEGATIVE
Trichomonas: NEGATIVE

## 2016-06-29 ENCOUNTER — Encounter (HOSPITAL_COMMUNITY): Payer: Self-pay | Admitting: Emergency Medicine

## 2016-06-29 ENCOUNTER — Ambulatory Visit (HOSPITAL_COMMUNITY)
Admission: EM | Admit: 2016-06-29 | Discharge: 2016-06-29 | Disposition: A | Discharge status: Home / Self Care | Payer: BLUE CROSS/BLUE SHIELD | Attending: Internal Medicine | Admitting: Internal Medicine

## 2016-06-29 DIAGNOSIS — F17210 Nicotine dependence, cigarettes, uncomplicated: Secondary | ICD-10-CM | POA: Insufficient documentation

## 2016-06-29 DIAGNOSIS — L02214 Cutaneous abscess of groin: Secondary | ICD-10-CM | POA: Diagnosis present

## 2016-06-29 DIAGNOSIS — L0291 Cutaneous abscess, unspecified: Secondary | ICD-10-CM | POA: Diagnosis not present

## 2016-06-29 DIAGNOSIS — Z79899 Other long term (current) drug therapy: Secondary | ICD-10-CM | POA: Diagnosis not present

## 2016-06-29 DIAGNOSIS — Z809 Family history of malignant neoplasm, unspecified: Secondary | ICD-10-CM | POA: Insufficient documentation

## 2016-06-29 MED ORDER — SULFAMETHOXAZOLE-TRIMETHOPRIM 800-160 MG PO TABS: 2.0000 | ORAL_TABLET | Freq: Two times a day (BID) | ORAL | 0 refills | Status: DC

## 2016-06-29 NOTE — ED Provider Notes (Signed)
CSN: [id number]     Arrival date & time 06/29/16  1626 History   First MD Initiated Contact with Patient 06/29/16 1753     Chief Complaint  Patient presents with  . Abscess   (Consider location/radiation/quality/duration/timing/severity/associated sxs/prior Treatment) 26 y.o. male presents with inguinal abscess X 3 days. Area is approximately 3.5 cm in length X 2 cm in width. Patient denies any fevers, area is flucutant.  Condition is acute in nature. Condition is made better by nothing. Condition is made worse by nothing. Patient denies any treatment prior to there arrival at this facility. Patient is requesting I&D without lidocaine       History reviewed. No pertinent past medical history. History reviewed. No pertinent surgical history. Family History  Problem Relation Age of Onset  . Cancer Maternal Grandmother    Social History  Substance Use Topics  . Smoking status: Current Every Day Smoker    Packs/day: 0.25    Types: Cigarettes  . Smokeless tobacco: Never Used  . Alcohol use Yes    Review of Systems  Constitutional: Negative for chills and fever.  HENT: Negative for ear pain and sore throat.   Eyes: Negative for pain and visual disturbance.  Respiratory: Negative for cough and shortness of breath.   Cardiovascular: Negative for chest pain and palpitations.  Gastrointestinal: Negative for abdominal pain and vomiting.  Genitourinary: Negative for dysuria and hematuria.  Musculoskeletal: Negative for arthralgias and back pain.  Skin: Negative for color change and rash.       abscess right groin  Neurological: Negative for seizures and syncope.  All other systems reviewed and are negative.   Allergies  Patient has no known allergies.  Home Medications   Prior to Admission medications   Medication Sig Start Date End Date Taking? Authorizing Provider  amoxicillin (AMOXIL) 500 MG capsule Take 1 capsule (500 mg total) by mouth 3 (three) times daily. 09/05/13    Ivonne Andrew, PA-C  chlorhexidine (HIBICLENS) 4 % external liquid Apply topically daily as needed. Use as body wash daily for 1 month 04/18/14   Reuben Likes, MD  HYDROcodone-acetaminophen (NORCO/VICODIN) 5-325 MG per tablet Take 1 tablet by mouth every 4 (four) hours as needed for pain. 12/24/12   Hayden Rasmussen, NP  HYDROcodone-acetaminophen (NORCO/VICODIN) 5-325 MG per tablet Take 1-2 tablets by mouth every 4 (four) hours as needed for moderate pain. 09/05/13   Ivonne Andrew, PA-C  HYDROcodone-acetaminophen (NORCO/VICODIN) 5-325 MG per tablet 1 to 2 tabs every 4 to 6 hours as needed for pain. 04/18/14   Reuben Likes, MD  ketorolac (TORADOL) 10 MG tablet Take 1 tablet (10 mg total) by mouth every 6 (six) hours as needed for pain. 12/26/12   Linna Hoff, MD  ketorolac (TORADOL) 10 MG tablet Take 1 tablet (10 mg total) by mouth every 6 (six) hours as needed. 03/28/16   Dorena Bodo, NP  mupirocin ointment (BACTROBAN) 2 % Apply to both nostrils TID for 1 month 04/18/14   Reuben Likes, MD  sulfamethoxazole-trimethoprim (BACTRIM DS,SEPTRA DS) 800-160 MG tablet Take 2 tablets by mouth 2 (two) times daily. 06/29/16   Alene Mires, NP  sulfamethoxazole-trimethoprim (SEPTRA DS) 800-160 MG per tablet Take 1 tablet by mouth 2 (two) times daily. X 7 days 12/24/12   Hayden Rasmussen, NP   Meds Ordered and Administered this Visit  Medications - No data to display  BP 125/82 (BP Location: Left Arm)   Pulse 63   Temp 98.5 F (36.9  C) (Oral)   Resp 20   SpO2 100%  No data found.   Physical Exam  Constitutional: He appears well-developed and well-nourished.  HENT:  Head: Normocephalic and atraumatic.  Eyes: Conjunctivae are normal.  Neck: Neck supple.  Cardiovascular: Normal rate and regular rhythm.   No murmur heard. Pulmonary/Chest: Effort normal and breath sounds normal. No respiratory distress.  Abdominal: Soft. There is no tenderness.  Musculoskeletal: He exhibits no edema.   Neurological: He is alert.  Skin: Skin is warm and dry.  fluctuant abscess approximately 3.5 cm in length X 2 cm in width.   Psychiatric: He has a normal mood and affect.  Nursing note and vitals reviewed.   Urgent Care Course     Procedures (including critical care time)  Labs Review Labs Reviewed - No data to display  Imaging Review No results found.   Patent decline prescription for hibclens at this time. Patient states that he wants to continue to dove.   MDM   1. Abscess       Alene Miresmohundro, Jennifer C, NP 06/29/16 [phone]

## 2016-06-29 NOTE — ED Triage Notes (Signed)
Here for abscess in the groin area onset 2-3 days associated w/swelling and pain  Denies drainage, fevers  A&O x4... NAD

## 2016-07-03 LAB — AEROBIC CULTURE W GRAM STAIN (SUPERFICIAL SPECIMEN)

## 2016-07-03 LAB — AEROBIC CULTURE  (SUPERFICIAL SPECIMEN): Culture: NORMAL

## 2016-07-22 ENCOUNTER — Emergency Department (HOSPITAL_COMMUNITY)
Admission: EM | Admit: 2016-07-22 | Discharge: 2016-07-22 | Disposition: A | Discharge status: Home / Self Care | Payer: BLUE CROSS/BLUE SHIELD | Attending: Emergency Medicine | Admitting: Emergency Medicine

## 2016-07-22 ENCOUNTER — Encounter (HOSPITAL_COMMUNITY): Payer: Self-pay | Admitting: *Deleted

## 2016-07-22 DIAGNOSIS — F17210 Nicotine dependence, cigarettes, uncomplicated: Secondary | ICD-10-CM | POA: Diagnosis not present

## 2016-07-22 DIAGNOSIS — K0889 Other specified disorders of teeth and supporting structures: Secondary | ICD-10-CM | POA: Insufficient documentation

## 2016-07-22 MED ORDER — IBUPROFEN 800 MG PO TABS
800.0000 mg | ORAL_TABLET | Freq: Once | ORAL | Status: AC
  Administered 2016-07-22: 800 mg via ORAL
  Filled 2016-07-22: qty 1

## 2016-07-22 MED ORDER — TRAMADOL HCL 50 MG PO TABS: 50.0000 mg | ORAL_TABLET | Freq: Four times a day (QID) | ORAL | 0 refills | Status: DC | PRN

## 2016-07-22 MED ORDER — PENICILLIN V POTASSIUM 500 MG PO TABS: 500.0000 mg | ORAL_TABLET | Freq: Three times a day (TID) | ORAL | 0 refills | Status: AC

## 2016-07-22 NOTE — ED Triage Notes (Signed)
Pt states L upper dental pain for several months.  Is supposed to get a root canal, but can't afford it.

## 2016-07-22 NOTE — Discharge Instructions (Signed)
Take Tramadol for breakthrough pain, do not drink alcohol, drive, care for children or do other critical tasks while taking percocet.  Return to the emergency room for fever, change in vision, redness to the face that rapidly spreads towards the eye, nausea or vomiting, difficulty swallowing or shortness of breath.   Apply warm compresses to jaw throughout the day.   Take your antibiotics as directed and to the end of the course.  Followup with a dentist is very important for ongoing evaluation and management of recurrent dental pain. Return to emergency department for emergent changing or worsening symptoms."  Low-cost dental clinic: [facility]**David  Civils  at [phone]**   You may also call [phone]  Dental Assistance If the dentist on-call cannot see you, please use the resources below:   Patients with Medicaid: [facility] [phone], [phone] [address], [phone]  If unable to pay, or uninsured, contact [facility] [phone]) or [facility] [phone] in [place], [phone] in [facility]) to become qualified for the adult dental clinic  Other Low-Cost Community Dental Services: [facility]- [address], [place], [place], [address]    [phone], Ext. [phone]    2nd and 4th Thursday of the month at 6:30am    10 clients each day by appointment, can sometimes see walk-in     patients if someone does not show for an appointment [facility]- [address], [place], [place], [address]    [phone] [facility]- [address], [place], [place], [address]    [phone]  [facility]- [phone] [facility]- [phone] [facility]- [phone]

## 2016-07-22 NOTE — ED Provider Notes (Signed)
Emergency Department Provider Note   I have reviewed the triage vital signs and the nursing notes.   HISTORY  Chief Complaint Dental Pain   HPI Jordan Blanchard is a 26 y.o. male with multiple visits to the ED for evaluation of dental pain presents to the emergent department for evaluation of left upper tooth pain. Patient states he went to the dentist and was told that he needed to be removed but could not afford the feet. He has been to multiple emergency departments for pain medication but states those are not working and needs something stronger. He states that at one recent emergency department visit he was given a shot in the leg but cannot remember what it was called. He denies any difficulty with eating or drinking. Pain is constant and severe. No radiation. No difficulty opening his jaw. No difficulty breathing.  History reviewed. No pertinent past medical history.  There are no active problems to display for this patient.   No past surgical history on file.  Current Outpatient Rx  . Order #: [id number] Class: Print  . Order #: [id number] Class: Normal  . Order #: [id number] Class: Print    Allergies Patient has no known allergies.  Family History  Problem Relation Age of Onset  . Cancer Maternal Grandmother     Social History Social History  Substance Use Topics  . Smoking status: Current Every Day Smoker    Packs/day: 0.25    Types: Cigarettes  . Smokeless tobacco: Never Used  . Alcohol use 4.2 oz/week    7 Cans of beer per week    Review of Systems  Constitutional: No fever/chills Eyes: No visual changes. ENT: No sore throat. Positive dental pain.  Cardiovascular: Denies chest pain. Respiratory: Denies shortness of breath. Gastrointestinal: No abdominal pain.  No nausea, no vomiting.  No diarrhea.  No constipation. Genitourinary: Negative for dysuria. Musculoskeletal: Negative for back pain. Skin: Negative for rash. Neurological: Negative for headaches,  focal weakness or numbness.  10-point ROS otherwise negative.  ____________________________________________   PHYSICAL EXAM:  VITAL SIGNS: ED Triage Vitals  Enc Vitals Group     BP 07/22/16 0719 (!) 162/84     Pulse Rate 07/22/16 0719 (!) 58     Resp 07/22/16 0719 16     Temp 07/22/16 0719 98.4 F (36.9 C)     Temp Source 07/22/16 0719 Oral     SpO2 07/22/16 0719 99 %     Weight 07/22/16 0719 170 lb (77.1 kg)     Height 07/22/16 0719 6' (1.829 m)     Pain Score 07/22/16 0718 10   Constitutional: Alert and oriented. Well appearing and in no acute distress. Eyes: Conjunctivae are normal.  Head: Atraumatic. Nose: No congestion/rhinnorhea. Mouth/Throat: Mucous membranes are moist.  Oropharynx non-erythematous. Diffuse poor dentition.  Neck: No stridor.  Cardiovascular: Normal rate, regular rhythm. Good peripheral circulation. Grossly normal heart sounds.   Respiratory: Normal respiratory effort.  No retractions. Lungs CTAB. Musculoskeletal: No lower extremity tenderness nor edema. No gross deformities of extremities. Neurologic:  Normal speech and language. No gross focal neurologic deficits are appreciated.   ____________________________________________   PROCEDURES  Procedure(s) performed:   Procedures  None ____________________________________________   INITIAL IMPRESSION / ASSESSMENT AND PLAN / ED COURSE  Pertinent labs & imaging results that were available during my care of the patient were reviewed by me and considered in my medical decision making (see chart for details).  Patient presents to the emergency department for evaluation of dental  pain. He has poor dentition and has been told by the dentist the tooth in question needs to be removed but he cannot afford it. I see no evidence of abscess but there is possibility for periapical abscess. No trismus. Patient managing oral secretions and speaking in normal tone of voice. Provided very small amount of pain  medication along with penicillin. Also provided a list of dental resources in the area and to try and help him get to a provider he can afford. Discussed that this pain will likely not go away until the tooth is removed and/or managed by a dentist. Patient verbalizes understanding.  At this time, I do not feel there is any life-threatening condition present. I have reviewed and discussed all results (EKG, imaging, lab, urine as appropriate), exam findings with patient. I have reviewed nursing notes and appropriate previous records.  I feel the patient is safe to be discharged home without further emergent workup. Discussed usual and customary return precautions. Patient and family (if present) verbalize understanding and are comfortable with this plan.  Patient will follow-up with their primary care provider. If they do not have a primary care provider, information for follow-up has been provided to them. All questions have been answered.    ____________________________________________  FINAL CLINICAL IMPRESSION(S) / ED DIAGNOSES  Final diagnoses:  Pain, dental     MEDICATIONS GIVEN DURING THIS VISIT:  None  NEW OUTPATIENT MEDICATIONS STARTED DURING THIS VISIT:  New Prescriptions   PENICILLIN V POTASSIUM (VEETID) 500 MG TABLET    Take 1 tablet (500 mg total) by mouth 3 (three) times daily.   TRAMADOL (ULTRAM) 50 MG TABLET    Take 1 tablet (50 mg total) by mouth every 6 (six) hours as needed.      Note:  This document was prepared using Dragon voice recognition software and may include unintentional dictation errors.  Alona BeneJoshua Lakishia Bourassa, MD Emergency Medicine   Lesette Frary, Arlyss RepressJoshua G, MD 07/22/16 [phone]

## 2017-06-01 ENCOUNTER — Other Ambulatory Visit: Payer: Self-pay

## 2017-06-01 ENCOUNTER — Emergency Department (HOSPITAL_COMMUNITY)
Admission: EM | Admit: 2017-06-01 | Discharge: 2017-06-01 | Disposition: A | Discharge status: Home / Self Care | Payer: BLUE CROSS/BLUE SHIELD | Attending: Emergency Medicine | Admitting: Emergency Medicine

## 2017-06-01 ENCOUNTER — Encounter (HOSPITAL_COMMUNITY): Payer: Self-pay

## 2017-06-01 DIAGNOSIS — G43A0 Cyclical vomiting, not intractable: Secondary | ICD-10-CM | POA: Insufficient documentation

## 2017-06-01 DIAGNOSIS — R1115 Cyclical vomiting syndrome unrelated to migraine: Secondary | ICD-10-CM

## 2017-06-01 DIAGNOSIS — Z79899 Other long term (current) drug therapy: Secondary | ICD-10-CM | POA: Insufficient documentation

## 2017-06-01 DIAGNOSIS — F17210 Nicotine dependence, cigarettes, uncomplicated: Secondary | ICD-10-CM | POA: Insufficient documentation

## 2017-06-01 LAB — COMPREHENSIVE METABOLIC PANEL
ALBUMIN: 4.9 g/dL (ref 3.5–5.0)
ALT: 18 U/L (ref 17–63)
AST: 25 U/L (ref 15–41)
Alkaline Phosphatase: 66 U/L (ref 38–126)
Anion gap: 14 (ref 5–15)
BUN: 9 mg/dL (ref 6–20)
CO2: 20 mmol/L — ABNORMAL LOW (ref 22–32)
Calcium: 9.7 mg/dL (ref 8.9–10.3)
Chloride: 103 mmol/L (ref 101–111)
Creatinine, Ser: 0.83 mg/dL (ref 0.61–1.24)
GFR calc Af Amer: >60 mL/min (ref >60)
GFR calc non Af Amer: >60 mL/min (ref >60)
Glucose, Bld: 83 mg/dL (ref 65–99)
POTASSIUM: 3.9 mmol/L (ref 3.5–5.1)
Sodium: 137 mmol/L (ref 135–145)
Total Bilirubin: 2.6 mg/dL — ABNORMAL HIGH (ref 0.3–1.2)
Total Protein: 8 g/dL (ref 6.5–8.1)

## 2017-06-01 LAB — URINALYSIS, ROUTINE W REFLEX MICROSCOPIC
BACTERIA UA: NONE SEEN
Bilirubin Urine: NEGATIVE
Glucose, UA: NEGATIVE mg/dL
Ketones, ur: 80 mg/dL — AB
Leukocytes, UA: NEGATIVE
Nitrite: NEGATIVE
Protein, ur: 30 mg/dL — AB
SPECIFIC GRAVITY, URINE: 1.029 (ref 1.005–1.030)
pH: 5 (ref 5.0–8.0)

## 2017-06-01 LAB — CBC
HEMATOCRIT: 49.7 % (ref 39.0–52.0)
Hemoglobin: 17.3 g/dL — ABNORMAL HIGH (ref 13.0–17.0)
MCH: 32.7 pg (ref 26.0–34.0)
MCHC: 34.8 g/dL (ref 30.0–36.0)
MCV: 94 fL (ref 78.0–100.0)
Platelets: 189 10*3/uL (ref 150–400)
RBC: 5.29 MIL/uL (ref 4.22–5.81)
RDW: 12.6 % (ref 11.5–15.5)
WBC: 11.7 10*3/uL — ABNORMAL HIGH (ref 4.0–10.5)

## 2017-06-01 LAB — LIPASE, BLOOD: LIPASE: 22 U/L (ref 11–51)

## 2017-06-01 MED ORDER — ONDANSETRON 4 MG PO TBDP: 4.0000 mg | ORAL_TABLET | Freq: Three times a day (TID) | ORAL | 0 refills | Status: DC | PRN

## 2017-06-01 MED ORDER — ONDANSETRON 4 MG PO TBDP
4.0000 mg | ORAL_TABLET | Freq: Once | ORAL | Status: AC | PRN
  Administered 2017-06-01: 4 mg via ORAL
  Filled 2017-06-01: qty 1

## 2017-06-01 NOTE — ED Triage Notes (Signed)
Pt reports vomiting that started this morning. Pt denies any abd pain other than when he is getting ready to vomit. Pt reports approximately 8 episodes of emesis. Skin warm and dry. No distress noted.

## 2017-06-01 NOTE — Discharge Instructions (Addendum)
Your symptoms are consistent with a viral illness. Viruses do not require antibiotics. Treatment is symptomatic care.  Hand washing: Wash your hands throughout the day, but especially before and after touching the face, using the restroom, sneezing, coughing, or touching surfaces that have been coughed or sneezed upon. Hydration: Symptoms will be intensified and complicated by dehydration. Dehydration can also extend the duration of symptoms. Drink plenty of fluids and get plenty of rest. You should be drinking at least half a liter of water an hour to stay hydrated. Electrolyte drinks (ex. Gatorade, Powerade, Pedialyte) are also encouraged. You should be drinking enough fluids to make your urine light yellow, almost clear. If this is not the case, you are not drinking enough water. Please note that some of the treatments indicated below will not be effective if you are not adequately hydrated. Diet: Please concentrate on hydration, however, you may introduce food slowly.  Start with a clear liquid diet, progressed to a full liquid diet, and then bland solids as you are able. Pain or fever: Ibuprofen, Naproxen, or Tylenol for pain or fever.  Nausea/vomiting: Use the Zofran for nausea or vomiting.  Follow up: Follow up with a primary care provider, as needed, for any future management of this issue.  To assist with sleep, you may use Benadryl or melatonin.  Both are available over-the-counter.

## 2017-06-01 NOTE — ED Provider Notes (Signed)
[facility]ENC[facility] Provider Note   CSN: [id number] Arrival date & time: 06/01/17  1525     History   Chief Complaint Chief Complaint  Patient presents with  . Emesis    HPI Jordan Blanchard is a 27 y.o. male.  HPI   Jordan Blanchard is a 27 y.o. male, patient with no pertinent past medical history, presenting to the ED with vomiting beginning this morning.  States he vomited "a few times." Patient was not very forthcoming with information.  He made a clear that he "felt better and needed to go." Denies abdominal pain, fever, diarrhea, constipation, hematochezia/melena, urinary complaints, shortness of breath, chest pain, or any other complaints.     History reviewed. No pertinent past medical history.  There are no active problems to display for this patient.   History reviewed. No pertinent surgical history.      Home Medications    Prior to Admission medications   Medication Sig Start Date End Date Taking? Authorizing Provider  ondansetron (ZOFRAN ODT) 4 MG disintegrating tablet Take 1 tablet (4 mg total) by mouth every 8 (eight) hours as needed for nausea or vomiting. 06/01/17   Solomon Skowronek C, PA-C  sulfamethoxazole-trimethoprim (BACTRIM DS,SEPTRA DS) 800-160 MG tablet Take 2 tablets by mouth 2 (two) times daily. 06/29/16   Alene Miresmohundro, Jennifer C, NP  traMADol (ULTRAM) 50 MG tablet Take 1 tablet (50 mg total) by mouth every 6 (six) hours as needed. 07/22/16   Long, Arlyss RepressJoshua G, MD    Family History Family History  Problem Relation Age of Onset  . Cancer Maternal Grandmother     Social History Social History   Tobacco Use  . Smoking status: Current Every Day Smoker    Packs/day: 0.25    Types: Cigarettes  . Smokeless tobacco: Never Used  Substance Use Topics  . Alcohol use: Yes    Alcohol/week: 4.2 oz    Types: 7 Cans of beer per week  . Drug use: Yes    Frequency: 3.0 times per week    Types: Marijuana     Allergies   Patient has  no known allergies.   Review of Systems Review of Systems  Constitutional: Negative for chills, diaphoresis and fever.  Respiratory: Negative for shortness of breath.   Cardiovascular: Negative for chest pain.  Gastrointestinal: Positive for nausea and vomiting. Negative for abdominal pain, blood in stool and diarrhea.  Genitourinary: Negative for discharge, dysuria, flank pain, frequency, hematuria and testicular pain.  Musculoskeletal: Negative for back pain.  All other systems reviewed and are negative.    Physical Exam Updated Vital Signs BP 123/73 (BP Location: Right Arm)   Pulse 68   Temp 98.3 F (36.8 C) (Oral)   Resp 12   Ht 6' (1.829 m)   Wt 72.6 kg (160 lb)   SpO2 100%   BMI 21.70 kg/m   Physical Exam  Constitutional: He appears well-developed and well-nourished. No distress.  HENT:  Head: Normocephalic and atraumatic.  Eyes: Conjunctivae are normal.  Neck: Neck supple.  Cardiovascular: Normal rate, regular rhythm, normal heart sounds and intact distal pulses.  Pulmonary/Chest: Effort normal and breath sounds normal. No respiratory distress.  Abdominal: Soft. There is no tenderness. There is no guarding.  Musculoskeletal: He exhibits no edema.  Lymphadenopathy:    He has no cervical adenopathy.  Neurological: He is alert.  Skin: Skin is warm and dry. He is not diaphoretic.  Psychiatric: He has a normal mood and affect. His  behavior is normal.  Nursing note and vitals reviewed.    ED Treatments / Results  Labs (all labs ordered are listed, but only abnormal results are displayed) Labs Reviewed  COMPREHENSIVE METABOLIC PANEL - Abnormal; Notable for the following components:      Result Value   CO2 20 (*)    Total Bilirubin 2.6 (*)    All other components within normal limits  CBC - Abnormal; Notable for the following components:   WBC 11.7 (*)    Hemoglobin 17.3 (*)    All other components within normal limits  URINALYSIS, ROUTINE W REFLEX  MICROSCOPIC - Abnormal; Notable for the following components:   Hgb urine dipstick SMALL (*)    Ketones, ur 80 (*)    Protein, ur 30 (*)    Squamous Epithelial / LPF 0-5 (*)    All other components within normal limits  LIPASE, BLOOD    EKG None  Radiology No results found.  Procedures Procedures (including critical care time)  Medications Ordered in ED Medications  ondansetron (ZOFRAN-ODT) disintegrating tablet 4 mg (4 mg Oral Given 06/01/17 1624)     Initial Impression / Assessment and Plan / ED Course  I have reviewed the triage vital signs and the nursing notes.  Pertinent labs & imaging results that were available during my care of the patient were reviewed by me and considered in my medical decision making (see chart for details).     Patient presents with vomiting. Patient is nontoxic appearing, afebrile, not tachycardic, not tachypneic, not hypotensive, and is in no apparent distress.  Tolerating PO. The patient was given instructions for home care as well as return precautions. Patient voices understanding of these instructions, accepts the plan, and is comfortable with discharge.  Vitals:   06/01/17 1553 06/01/17 1554 06/01/17 2026  BP: 123/73  133/70  Pulse: 68  70  Resp: 12  14  Temp: 98.3 F (36.8 C)  98.4 F (36.9 C)  TempSrc: Oral  Oral  SpO2: 100%  100%  Weight:  72.6 kg (160 lb)   Height:  6' (1.829 m)     Final Clinical Impressions(s) / ED Diagnoses   Final diagnoses:  Non-intractable cyclical vomiting with nausea    ED Discharge Orders        Ordered    ondansetron (ZOFRAN ODT) 4 MG disintegrating tablet  Every 8 hours PRN     06/01/17 2020       Anselm Pancoast, PA-C 06/01/17 2030    Tilden Fossa, MD 06/02/17 1204

## 2017-06-01 NOTE — ED Notes (Addendum)
Pt asleep upon this RN entering room. Per pt, no emesis since after ordered zofran administration. Pt states to this RN "Yo, yo, yo for real though. I need to go now. So he needs to come in ASAP." This RN explained delay to patient and notified pt that EDP would be in as soon as possible. Shawn, PA updated to pt status. Pt with no complaints at this time

## 2017-06-01 NOTE — ED Notes (Signed)
Pt verbalized understanding of all d/c instructions, prescriptions, and f/u information. Opportunity for questioning and answers provided. VSS. All belongings with patient at this time. Pt ambulatory to lobby with steady gait.   

## 2017-06-03 ENCOUNTER — Telehealth (HOSPITAL_COMMUNITY): Payer: Self-pay | Admitting: Emergency Medicine

## 2017-06-03 ENCOUNTER — Ambulatory Visit (HOSPITAL_COMMUNITY)
Admission: EM | Admit: 2017-06-03 | Discharge: 2017-06-03 | Disposition: A | Discharge status: Home / Self Care | Payer: Self-pay | Attending: Family Medicine | Admitting: Family Medicine

## 2017-06-03 ENCOUNTER — Other Ambulatory Visit: Payer: Self-pay

## 2017-06-03 ENCOUNTER — Encounter (HOSPITAL_COMMUNITY): Payer: Self-pay | Admitting: Emergency Medicine

## 2017-06-03 DIAGNOSIS — H1033 Unspecified acute conjunctivitis, bilateral: Secondary | ICD-10-CM

## 2017-06-03 MED ORDER — OLOPATADINE HCL 0.1 % OP SOLN: 1.0000 [drp] | Freq: Two times a day (BID) | OPHTHALMIC | 0 refills | Status: DC

## 2017-06-03 NOTE — ED Triage Notes (Signed)
Pt reports bilateral eye infection that started two days ago.  Pts eyes are red today.

## 2017-06-03 NOTE — ED Provider Notes (Signed)
[facility]   [id number] 06/03/17 Arrival Time: 1003  ASSESSMENT & PLAN:  1. Acute conjunctivitis of both eyes, unspecified acute conjunctivitis type    Meds ordered this encounter  Medications  . olopatadine (PATANOL) 0.1 % ophthalmic solution    Sig: Place 1 drop into both eyes 2 (two) times daily.    Dispense:  5 mL    Refill:  0   Ophthalmic drops per orders. Local eye care discussed. Analgesics as needed. Tetanus immunization not indicated.  May f/u with PCP or here if not seeing improvement over the next 48-72 hours, sooner if needed.  Reviewed expectations re: course of current medical issues. Questions answered. Outlined signs and symptoms indicating need for more acute intervention. Patient verbalized understanding. After Visit Summary given.   SUBJECTIVE:  Jordan Blanchard is a 27 y.o. male who presents with complaint of persistent bilateral eye irritation; "gritty feeling". Onset gradual, approximately 2 days ago after getting shampoo in his eyes. Injury: no. Visual changes: no. Contact lens use: no. Self treatment: none. Slight watery drainage reported. No specific eye pain.  ROS: As per HPI.  OBJECTIVE:  Vitals:   06/03/17 1042  BP: 113/78  Pulse: (!) 50  Temp: 98.1 F (36.7 C)  TempSrc: Oral  SpO2: 100%    General appearance: alert; no distress Eyes: conjunctiva: 1+ injection bilaterally; EOMI without pain; PERRLA; no ciliary flush or corneal opacities Neck: supple Lungs: clear to auscultation bilaterally Heart: regular rate and rhythm Skin: warm and dry Psychological: alert and cooperative; normal mood and affect  No Known Allergies   Social History   Socioeconomic History  . Marital status: Single    Spouse name: Not on file  . Number of children: Not on file  . Years of education: Not on file  . Highest education level: Not on file  Occupational History  . Not on file  Social Needs  . Financial resource strain: Not on file   . Food insecurity:    Worry: Not on file    Inability: Not on file  . Transportation needs:    Medical: Not on file    Non-medical: Not on file  Tobacco Use  . Smoking status: Current Every Day Smoker    Packs/day: 0.25    Types: Cigarettes  . Smokeless tobacco: Never Used  Substance and Sexual Activity  . Alcohol use: Yes    Alcohol/week: 4.2 oz    Types: 7 Cans of beer per week  . Drug use: Yes    Frequency: 3.0 times per week    Types: Marijuana  . Sexual activity: Not on file  Lifestyle  . Physical activity:    Days per week: Not on file    Minutes per session: Not on file  . Stress: Not on file  Relationships  . Social connections:    Talks on phone: Not on file    Gets together: Not on file    Attends religious service: Not on file    Active member of club or organization: Not on file    Attends meetings of clubs or organizations: Not on file    Relationship status: Not on file  . Intimate partner violence:    Fear of current or ex partner: Not on file    Emotionally abused: Not on file    Physically abused: Not on file    Forced sexual activity: Not on file  Other Topics Concern  . Not on file  Social History Narrative  .  Not on file   Family History  Problem Relation Age of Onset  . Cancer Maternal Grandmother    History reviewed. No pertinent surgical history.   Mardella Layman, MD 06/03/17 1113

## 2017-06-03 NOTE — Telephone Encounter (Signed)
Pt was under the assumption he was getting an antibiotic.  Also his Rx is over $200.  Spoke with Dr. Tracie HarrierHagler and he recommended Zatidor OTC BID.  Called pt and he was agreeable to this.  Called pharmacy back so she could assist him in finding the medication when he returns.

## 2017-08-27 ENCOUNTER — Ambulatory Visit (HOSPITAL_COMMUNITY)
Admission: EM | Admit: 2017-08-27 | Discharge: 2017-08-27 | Disposition: A | Discharge status: Home / Self Care | Payer: Self-pay | Attending: Internal Medicine | Admitting: Internal Medicine

## 2017-08-27 ENCOUNTER — Encounter (HOSPITAL_COMMUNITY): Payer: Self-pay | Admitting: Emergency Medicine

## 2017-08-27 ENCOUNTER — Ambulatory Visit (INDEPENDENT_AMBULATORY_CARE_PROVIDER_SITE_OTHER): Payer: Self-pay

## 2017-08-27 DIAGNOSIS — T148XXA Other injury of unspecified body region, initial encounter: Secondary | ICD-10-CM

## 2017-08-27 MED ORDER — CYCLOBENZAPRINE HCL 10 MG PO TABS: 10.0000 mg | ORAL_TABLET | Freq: Every evening | ORAL | 0 refills | Status: AC | PRN

## 2017-08-27 NOTE — ED Triage Notes (Signed)
Provider triage  

## 2017-08-27 NOTE — ED Provider Notes (Signed)
[facility]    CSN: [id number] Arrival date & time: 08/27/17  1212     History   Chief Complaint Chief Complaint  Patient presents with  . Back Pain    HPI Jordan Blanchard is a 27 y.o. male.   27 yo male c/o back pain/left shoulder blade. Reports that pain occasionally radiates anteriorly but is not assoc with SOB, nausea or vomiting. He admits to lifting weights yesterday but states that the back pain was there before that. Denies trauma, recent surgery, fevers, or abdominal pain. Admits to diarrhea last week that did not last long. Stools are normal appearance now. Denies symptoms assoc w/ eating or with fatty foods.      History reviewed. No pertinent past medical history.  There are no active problems to display for this patient.   History reviewed. No pertinent surgical history.     Home Medications    Prior to Admission medications   Medication Sig Start Date End Date Taking? Authorizing Provider  cyclobenzaprine (FLEXERIL) 10 MG tablet Take 1 tablet (10 mg total) by mouth at bedtime as needed for up to 10 days for muscle spasms. 08/27/17 09/06/17  Arnaldo Natal, MD  olopatadine (PATANOL) 0.1 % ophthalmic solution Place 1 drop into both eyes 2 (two) times daily. 06/03/17   Mardella Layman, MD  ondansetron (ZOFRAN ODT) 4 MG disintegrating tablet Take 1 tablet (4 mg total) by mouth every 8 (eight) hours as needed for nausea or vomiting. 06/01/17   Joy, Hillard Danker, PA-C    Family History Family History  Problem Relation Age of Onset  . Cancer Maternal Grandmother     Social History Social History   Tobacco Use  . Smoking status: Current Every Day Smoker    Packs/day: 0.25    Types: Cigarettes  . Smokeless tobacco: Never Used  Substance Use Topics  . Alcohol use: Yes    Alcohol/week: 4.2 oz    Types: 7 Cans of beer per week  . Drug use: Yes    Frequency: 3.0 times per week    Types: Marijuana     Allergies   Patient has no known  allergies.   Review of Systems Review of Systems   Physical Exam Triage Vital Signs ED Triage Vitals [08/27/17 1347]  Enc Vitals Group     BP 120/84     Pulse Rate (!) 45     Resp 16     Temp 98.7 F (37.1 C)     Temp Source Oral     SpO2 99 %     Weight      Height      Head Circumference      Peak Flow      Pain Score      Pain Loc      Pain Edu?      Excl. in [place]?    No data found.  Updated Vital Signs BP 120/84 (BP Location: Right Arm)   Pulse (!) 45   Temp 98.7 F (37.1 C) (Oral)   Resp 16   SpO2 99%   Visual Acuity Right Eye Distance:   Left Eye Distance:   Bilateral Distance:    Right Eye Near:   Left Eye Near:    Bilateral Near:     Physical Exam   UC Treatments / Results  Labs (all labs ordered are listed, but only abnormal results are displayed) Labs Reviewed - No data to display  EKG None  Radiology Dg  Chest 2 View  Result Date: 08/27/2017 CLINICAL DATA:  Chest pain. EXAM: CHEST - 2 VIEW COMPARISON:  Radiograph of January 23, 2010 FINDINGS: The heart size and mediastinal contours are within normal limits. Both lungs are clear. No pneumothorax or pleural effusion is noted. The visualized skeletal structures are unremarkable. IMPRESSION: No active cardiopulmonary disease. Electronically Signed   By: Lupita RaiderJames  Green Jr, M.D.   On: 08/27/2017 14:22    Procedures Procedures (including critical care time)  Medications Ordered in UC Medications - No data to display  Initial Impression / Assessment and Plan / UC Course  I have reviewed the triage vital signs and the nursing notes.  Pertinent labs & imaging results that were available during my care of the patient were reviewed by me and considered in my medical decision making (see chart for details).     Likely musculoskeletal pain (subscapularis). DDx includes gallbladder disease and PE-- both low likelihood as no risk factors. Rx muscle relaxer qhs prn.  Final Clinical Impressions(s) /  UC Diagnoses   Final diagnoses:  Muscle strain   Discharge Instructions   None    ED Prescriptions    Medication Sig Dispense Auth. Provider   cyclobenzaprine (FLEXERIL) 10 MG tablet Take 1 tablet (10 mg total) by mouth at bedtime as needed for up to 10 days for muscle spasms. 20 tablet Arnaldo Nataliamond, Kentavius Dettore S, MD     Controlled Substance Prescriptions [place] Controlled Substance Registry consulted? Not Applicable   Arnaldo Nataliamond, Raford Brissett S, MD 08/27/17 [id number]

## 2018-10-28 ENCOUNTER — Ambulatory Visit (HOSPITAL_COMMUNITY)
Admission: EM | Admit: 2018-10-28 | Discharge: 2018-10-28 | Disposition: A | Discharge status: Home / Self Care | Payer: Self-pay | Attending: Family Medicine | Admitting: Family Medicine

## 2018-10-28 ENCOUNTER — Encounter (HOSPITAL_COMMUNITY): Payer: Self-pay

## 2018-10-28 ENCOUNTER — Other Ambulatory Visit: Payer: Self-pay

## 2018-10-28 DIAGNOSIS — N342 Other urethritis: Secondary | ICD-10-CM | POA: Insufficient documentation

## 2018-10-28 LAB — POCT URINALYSIS DIP (DEVICE)
Bilirubin Urine: NEGATIVE
Glucose, UA: NEGATIVE mg/dL
Ketones, ur: NEGATIVE mg/dL
Leukocytes,Ua: NEGATIVE
Nitrite: NEGATIVE
Protein, ur: NEGATIVE mg/dL
Specific Gravity, Urine: >=1.03 (ref 1.005–1.030)
Urobilinogen, UA: 0.2 mg/dL (ref 0.0–1.0)
pH: 6 (ref 5.0–8.0)

## 2018-10-28 LAB — RPR: RPR Ser Ql: NONREACTIVE

## 2018-10-28 MED ORDER — CEFTRIAXONE SODIUM 250 MG IJ SOLR
INTRAMUSCULAR | Status: AC
  Filled 2018-10-28: qty 250

## 2018-10-28 MED ORDER — AZITHROMYCIN 250 MG PO TABS
ORAL_TABLET | ORAL | Status: AC
  Filled 2018-10-28: qty 4

## 2018-10-28 MED ORDER — METRONIDAZOLE 500 MG PO TABS
ORAL_TABLET | ORAL | Status: AC
  Filled 2018-10-28: qty 4

## 2018-10-28 MED ORDER — CEFTRIAXONE SODIUM 250 MG IJ SOLR
250.0000 mg | Freq: Once | INTRAMUSCULAR | Status: AC
  Administered 2018-10-28: 250 mg via INTRAMUSCULAR

## 2018-10-28 MED ORDER — AZITHROMYCIN 250 MG PO TABS
1000.0000 mg | ORAL_TABLET | Freq: Once | ORAL | Status: AC
  Administered 2018-10-28: 1000 mg via ORAL

## 2018-10-28 MED ORDER — METRONIDAZOLE 500 MG PO TABS
2000.0000 mg | ORAL_TABLET | Freq: Once | ORAL | Status: AC
  Administered 2018-10-28: 2000 mg via ORAL

## 2018-10-28 MED ORDER — LIDOCAINE HCL (PF) 1 % IJ SOLN
INTRAMUSCULAR | Status: AC
  Filled 2018-10-28: qty 2

## 2018-10-28 NOTE — ED Triage Notes (Signed)
Patient presents to Urgent Care with complaints of burning with urination and penile swelling since a few weeks ago. Patient reports his partner told him she has trichomonas and he needs treatment.

## 2018-10-28 NOTE — Discharge Instructions (Addendum)
We are testing you for STDs today.  Sending the swab for testing and blood work. We are also treating you today for gonorrhea, chlamydia and trichomonas. Please refrain from sexual activity for at least 7 days to ensure the infection has cleared Your urine did not show any signs of urinary tract infection.

## 2018-10-28 NOTE — ED Provider Notes (Signed)
[facility]    CSN: [id number] Arrival date & time: 10/28/18  0093      History   Chief Complaint Chief Complaint  Patient presents with  . Exposure to STD    HPI Jordan Blanchard is a 28 y.o. male.   Patient is a 28 year old male that presents today for exposure to STD.  Reporting that his sexual partner told him that she has trichomonas and recommended treatment.  He is having some dysuria.  Symptoms have been constant for the past 3 days.  He is also had some mild right flank pain.  Denies any penile discharge, penile pain, testicle pain or swelling.  Denies any rashes.  No fever, chills, body aches, night sweats.  ROS per HPI      History reviewed. No pertinent past medical history.  There are no active problems to display for this patient.   History reviewed. No pertinent surgical history.     Home Medications    Prior to Admission medications   Medication Sig Start Date End Date Taking? Authorizing Provider  olopatadine (PATANOL) 0.1 % ophthalmic solution Place 1 drop into both eyes 2 (two) times daily. 06/03/17   Vanessa Kick, MD  ondansetron (ZOFRAN ODT) 4 MG disintegrating tablet Take 1 tablet (4 mg total) by mouth every 8 (eight) hours as needed for nausea or vomiting. 06/01/17   Joy, Helane Gunther, PA-C    Family History Family History  Problem Relation Age of Onset  . Cancer Maternal Grandmother   . Healthy Mother   . Healthy Father     Social History Social History   Tobacco Use  . Smoking status: Current Every Day Smoker    Packs/day: 0.50    Types: Cigarettes  . Smokeless tobacco: Never Used  . Tobacco comment: buys singles  Substance Use Topics  . Alcohol use: Yes    Alcohol/week: 7.0 standard drinks    Types: 7 Cans of beer per week  . Drug use: Yes    Frequency: 3.0 times per week    Types: Marijuana     Allergies   Patient has no known allergies.   Review of Systems Review of Systems   Physical Exam Triage Vital Signs ED  Triage Vitals  Enc Vitals Group     BP 10/28/18 0848 129/83     Pulse Rate 10/28/18 0848 (!) 58     Resp 10/28/18 0848 16     Temp 10/28/18 0848 98.3 F (36.8 C)     Temp Source 10/28/18 0848 Temporal     SpO2 10/28/18 0848 100 %     Weight --      Height --      Head Circumference --      Peak Flow --      Pain Score 10/28/18 0908 4     Pain Loc --      Pain Edu? --      Excl. in [place]? --    No data found.  Updated Vital Signs BP 129/83 (BP Location: Left Arm)   Pulse (!) 58   Temp 98.3 F (36.8 C) (Temporal)   Resp 16   SpO2 100%   Visual Acuity Right Eye Distance:   Left Eye Distance:   Bilateral Distance:    Right Eye Near:   Left Eye Near:    Bilateral Near:     Physical Exam Vitals signs and nursing note reviewed.  Constitutional:      Appearance: Normal appearance.  HENT:  Head: Normocephalic and atraumatic.     Nose: Nose normal.  Eyes:     Conjunctiva/sclera: Conjunctivae normal.  Neck:     Musculoskeletal: Normal range of motion.  Pulmonary:     Effort: Pulmonary effort is normal.  Abdominal:     Palpations: Abdomen is soft.     Tenderness: There is no abdominal tenderness. There is no right CVA tenderness or left CVA tenderness.  Musculoskeletal: Normal range of motion.  Skin:    General: Skin is warm and dry.  Neurological:     Mental Status: He is alert.  Psychiatric:        Mood and Affect: Mood normal.      UC Treatments / Results  Labs (all labs ordered are listed, but only abnormal results are displayed) Labs Reviewed  POCT URINALYSIS DIP (DEVICE) - Abnormal; Notable for the following components:      Result Value   Hgb urine dipstick TRACE (*)    All other components within normal limits  HIV ANTIBODY (ROUTINE TESTING W REFLEX)  RPR  CYTOLOGY, (ORAL, ANAL, URETHRAL) ANCILLARY ONLY    EKG   Radiology No results found.  Procedures Procedures (including critical care time)  Medications Ordered in UC Medications   azithromycin (ZITHROMAX) tablet 1,000 mg (has no administration in time range)  cefTRIAXone (ROCEPHIN) injection 250 mg (has no administration in time range)  metroNIDAZOLE (FLAGYL) tablet 2,000 mg (has no administration in time range)    Initial Impression / Assessment and Plan / UC Course  I have reviewed the triage vital signs and the nursing notes.  Pertinent labs & imaging results that were available during my care of the patient were reviewed by me and considered in my medical decision making (see chart for details).     Urine negative for infection. Most likely symptoms are related to STD.  We will go ahead and prophylactically treat for gonorrhea, chlamydia and trichomonas today. Sending swab for STDs and blood work for HIV and syphilis. Safe sex practice instructions.  Final Clinical Impressions(s) / UC Diagnoses   Final diagnoses:  Urethritis     Discharge Instructions     We are testing you for STDs today.  Sending the swab for testing and blood work. We are also treating you today for gonorrhea, chlamydia and trichomonas. Please refrain from sexual activity for at least 7 days to ensure the infection has cleared Your urine did not show any signs of urinary tract infection.    ED Prescriptions    None     Controlled Substance Prescriptions [place] Controlled Substance Registry consulted? Not Applicable   Janace ArisBast, Damond Borchers A, NP 10/28/18 1000

## 2018-10-29 LAB — HIV ANTIBODY (ROUTINE TESTING W REFLEX): HIV Screen 4th Generation wRfx: NONREACTIVE

## 2018-10-29 LAB — CYTOLOGY, (ORAL, ANAL, URETHRAL) ANCILLARY ONLY
Chlamydia: NEGATIVE
Neisseria Gonorrhea: NEGATIVE
Trichomonas: POSITIVE — AB

## 2018-11-02 ENCOUNTER — Telehealth (HOSPITAL_COMMUNITY): Payer: Self-pay | Admitting: Emergency Medicine

## 2018-11-02 NOTE — Telephone Encounter (Signed)
Trichomonas is positive. Rx metronidazole was given at the urgent care visit. Pt needs education to please refrain from sexual intercourse for 7 days to give the medicine time to work. Sexual partners need to be notified and tested/treated. Condoms may reduce risk of reinfection. Recheck for further evaluation if symptoms are not improving.   Attempted to reach patient. No answer at this time. Voicemail left.    

## 2018-11-04 ENCOUNTER — Telehealth (HOSPITAL_COMMUNITY): Payer: Self-pay | Admitting: Emergency Medicine

## 2018-11-04 NOTE — Telephone Encounter (Signed)
Patient contacted and made aware of   cytology results, all questions answered  

## 2019-05-12 ENCOUNTER — Encounter (HOSPITAL_COMMUNITY): Payer: Self-pay | Admitting: Emergency Medicine

## 2019-05-12 ENCOUNTER — Ambulatory Visit (HOSPITAL_COMMUNITY)
Admission: EM | Admit: 2019-05-12 | Discharge: 2019-05-12 | Disposition: A | Discharge status: Home / Self Care | Payer: Self-pay | Attending: Family Medicine | Admitting: Family Medicine

## 2019-05-12 ENCOUNTER — Other Ambulatory Visit: Payer: Self-pay

## 2019-05-12 DIAGNOSIS — B9689 Other specified bacterial agents as the cause of diseases classified elsewhere: Secondary | ICD-10-CM

## 2019-05-12 DIAGNOSIS — L089 Local infection of the skin and subcutaneous tissue, unspecified: Secondary | ICD-10-CM

## 2019-05-12 MED ORDER — DOXYCYCLINE HYCLATE 100 MG PO CAPS: 100.0000 mg | ORAL_CAPSULE | Freq: Two times a day (BID) | ORAL | 0 refills | Status: DC

## 2019-05-12 NOTE — ED Triage Notes (Signed)
Abscess in right groin, this has happened in same location multiple times.

## 2019-05-12 NOTE — ED Provider Notes (Signed)
[facility]   [id number] 05/12/19 Arrival Time: 1046  ASSESSMENT & PLAN:  1. Localized bacterial skin infection     No indication for I&D of area at this time.  Begin: Meds ordered this encounter  Medications  . doxycycline (VIBRAMYCIN) 100 MG capsule    Sig: Take 1 capsule (100 mg total) by mouth 2 (two) times daily.    Dispense:  14 capsule    Refill:  0    Follow-up Information    [facility].   Specialty: Urgent Care Why: If worsening or failing to improve as anticipated. Contact information: [address] [place] [address] [phone]          Will follow up with PCP or here if worsening or failing to improve as anticipated. Reviewed expectations re: course of current medical issues. Questions answered. Outlined signs and symptoms indicating need for more acute intervention. Patient verbalized understanding. After Visit Summary given.   SUBJECTIVE:  Jordan Blanchard is a 29 y.o. male who presents with a skin complaint. H/O similar at same location in the past; sometimes requiring I&D. Present for a few days. R groin. Tender now. No bleeding or drainage. Afebrile. Has tried soaking in hot water "to see if it would open"; no relief. Ambulatory without difficulty. No penile d/c or urinary symptoms.   OBJECTIVE: Vitals:   05/12/19 1109 05/12/19 1110  BP:  113/75  Pulse: 61   Resp: 16   Temp: 98.3 F (36.8 C)   TempSrc: Oral   SpO2: 97% 98%    General appearance: alert; no distress HEENT: [place]; AT Neck: supple with FROM GU: normal appearing genitalia; R inferior perineum with approx 0.5 cm very slight skin induration with overlying erythema; no fluctuance; is TTP; no drainage or bleeding Extremities: no edema; moves all extremities normally Psychological: alert and cooperative; normal mood and affect  No Known Allergies   Social History   Socioeconomic History  . Marital status: Single   Spouse name: Not on file  . Number of children: Not on file  . Years of education: Not on file  . Highest education level: Not on file  Occupational History  . Not on file  Tobacco Use  . Smoking status: Current Every Day Smoker    Packs/day: 0.50    Types: Cigarettes  . Smokeless tobacco: Never Used  . Tobacco comment: buys singles  Substance and Sexual Activity  . Alcohol use: Yes    Alcohol/week: 7.0 standard drinks    Types: 7 Cans of beer per week  . Drug use: Yes    Frequency: 3.0 times per week    Types: Marijuana  . Sexual activity: Yes    Birth control/protection: None  Other Topics Concern  . Not on file  Social History Narrative  . Not on file   Social Determinants of Health   Financial Resource Strain:   . Difficulty of Paying Living Expenses:   Food Insecurity:   . Worried About Programme researcher, broadcasting/film/video in the Last Year:   . Barista in the Last Year:   Transportation Needs:   . Freight forwarder (Medical):   Marland Kitchen Lack of Transportation (Non-Medical):   Physical Activity:   . Days of Exercise per Week:   . Minutes of Exercise per Session:   Stress:   . Feeling of Stress :   Social Connections:   . Frequency of Communication with Friends and Family:   .  Frequency of Social Gatherings with Friends and Family:   . Attends Religious Services:   . Active Member of Clubs or Organizations:   . Attends Archivist Meetings:   Marland Kitchen Marital Status:   Intimate Partner Violence:   . Fear of Current or Ex-Partner:   . Emotionally Abused:   Marland Kitchen Physically Abused:   . Sexually Abused:    Family History  Problem Relation Age of Onset  . Cancer Maternal Grandmother   . Healthy Mother   . Healthy Father    History reviewed. No pertinent surgical history.   Vanessa Kick, MD 05/12/19 [phone]

## 2019-05-13 ENCOUNTER — Other Ambulatory Visit: Payer: Self-pay

## 2019-05-13 ENCOUNTER — Encounter (HOSPITAL_COMMUNITY): Payer: Self-pay

## 2019-05-13 ENCOUNTER — Ambulatory Visit (HOSPITAL_COMMUNITY)
Admission: EM | Admit: 2019-05-13 | Discharge: 2019-05-13 | Disposition: A | Discharge status: Home / Self Care | Payer: Self-pay | Attending: Family Medicine | Admitting: Family Medicine

## 2019-05-13 DIAGNOSIS — L0291 Cutaneous abscess, unspecified: Secondary | ICD-10-CM

## 2019-05-13 MED ORDER — PENTAFLUOROPROP-TETRAFLUOROETH EX AERO
INHALATION_SPRAY | CUTANEOUS | Status: AC
  Filled 2019-05-13: qty 116

## 2019-05-13 NOTE — ED Provider Notes (Signed)
[place]   [id number] 05/13/19 Arrival Time: 6301  ASSESSMENT & PLAN:  1. Abscess     Incision and Drainage Procedure Note  Anesthesia: Pain Ease freezing spray (declines lidocaine)  Procedure Details  The procedure, risks and complications have been discussed in detail (including, but not limited to pain and bleeding) with the patient.  The skin induration was prepped and draped in the usual fashion. After adequate local anesthesia, I&D with a #11 blade was performed on the right perineum with some purulent drainage.  EBL: minimal Drains: none Packing: none Condition: Tolerated procedure well Complications: none.  Will continue and finish doxycycline given yesterday. OTC analgesics as needed.   Reviewed expectations re: course of current medical issues. Questions answered. Outlined signs and symptoms indicating need for more acute intervention. Patient verbalized understanding. After Visit Summary given.   SUBJECTIVE:  Jordan Blanchard is a 29 y.o. male who  I saw yesterday. Started doxycycline but reports area in groin has enlarged and is more painful today. No drainage or bleeding.    OBJECTIVE:  Vitals:   05/13/19 0956 05/13/19 0957  BP: 116/78   Pulse: 75   Resp: 18   Temp: 98.6 F (37 C)   TempSrc: Oral   SpO2: 98%   Weight:  77.1 kg     General appearance: alert; no distress Skin: approx 0.5 cm induration of his R perineum; does have slight skin thickening around this area today; tender to touch; no active drainage or bleeding Psychological: alert and cooperative; normal mood and affect  No Known Allergies  History reviewed. No pertinent past medical history. Social History   Socioeconomic History  . Marital status: Single    Spouse name: Not on file  . Number of children: Not on file  . Years of education: Not on file  . Highest education level: Not on file  Occupational History  . Not on file  Tobacco Use  . Smoking status:  Current Every Day Smoker    Packs/day: 0.50    Types: Cigarettes  . Smokeless tobacco: Never Used  . Tobacco comment: buys singles  Substance and Sexual Activity  . Alcohol use: Yes    Alcohol/week: 7.0 standard drinks    Types: 7 Cans of beer per week  . Drug use: Yes    Frequency: 3.0 times per week    Types: Marijuana  . Sexual activity: Yes    Birth control/protection: None  Other Topics Concern  . Not on file  Social History Narrative  . Not on file   Social Determinants of Health   Financial Resource Strain:   . Difficulty of Paying Living Expenses:   Food Insecurity:   . Worried About Charity fundraiser in the Last Year:   . Arboriculturist in the Last Year:   Transportation Needs:   . Film/video editor (Medical):   Marland Kitchen Lack of Transportation (Non-Medical):   Physical Activity:   . Days of Exercise per Week:   . Minutes of Exercise per Session:   Stress:   . Feeling of Stress :   Social Connections:   . Frequency of Communication with Friends and Family:   . Frequency of Social Gatherings with Friends and Family:   . Attends Religious Services:   . Active Member of Clubs or Organizations:   . Attends Archivist Meetings:   Marland Kitchen Marital Status:    Family History  Problem Relation Age of Onset  . Cancer Maternal Grandmother   .  Healthy Mother   . Healthy Father    History reviewed. No pertinent surgical history.         Mardella Layman, MD 05/13/19 1113

## 2019-05-13 NOTE — ED Triage Notes (Signed)
Pt states he has abscess in his groin area on the right side x 6 day

## 2019-05-13 NOTE — Discharge Instructions (Signed)
Finish all of your antibiotic.

## 2019-08-17 ENCOUNTER — Encounter (HOSPITAL_COMMUNITY): Payer: Self-pay | Admitting: Emergency Medicine

## 2019-08-17 ENCOUNTER — Other Ambulatory Visit: Payer: Self-pay

## 2019-08-17 ENCOUNTER — Ambulatory Visit (HOSPITAL_COMMUNITY)
Admission: EM | Admit: 2019-08-17 | Discharge: 2019-08-17 | Disposition: A | Discharge status: Home / Self Care | Payer: Self-pay

## 2019-08-17 ENCOUNTER — Encounter (HOSPITAL_COMMUNITY): Payer: Self-pay

## 2019-08-17 ENCOUNTER — Emergency Department (HOSPITAL_COMMUNITY)
Admission: EM | Admit: 2019-08-17 | Discharge: 2019-08-17 | Disposition: A | Discharge status: Home / Self Care | Payer: Self-pay | Attending: Emergency Medicine | Admitting: Emergency Medicine

## 2019-08-17 DIAGNOSIS — Z5321 Procedure and treatment not carried out due to patient leaving prior to being seen by health care provider: Secondary | ICD-10-CM | POA: Insufficient documentation

## 2019-08-17 DIAGNOSIS — T391X1A Poisoning by 4-Aminophenol derivatives, accidental (unintentional), initial encounter: Secondary | ICD-10-CM | POA: Insufficient documentation

## 2019-08-17 DIAGNOSIS — R1110 Vomiting, unspecified: Secondary | ICD-10-CM | POA: Insufficient documentation

## 2019-08-17 DIAGNOSIS — K0889 Other specified disorders of teeth and supporting structures: Secondary | ICD-10-CM | POA: Insufficient documentation

## 2019-08-17 NOTE — ED Triage Notes (Signed)
Pt presents today c/o tooth pain. Of note pt states he took approx 20 tylenol in 24 hours for dental pain. Pt endorsing n/v. Pt states vomit is green, and occasionally bloody. Pt denies abdominal pain.

## 2019-08-17 NOTE — ED Notes (Signed)
Patient is being discharged from the Urgent Care and sent to the Emergency Department via POV/family. Per Omer Jack, PA patient is in need of higher level of care due to tylenol overdose. Patient is aware and verbalizes understanding of plan of care.  Vitals:   08/17/19 1554  BP: 125/78  Pulse: (!) 58  Resp: 16  Temp: 98.1 F (36.7 C)  SpO2: 98%

## 2019-08-17 NOTE — ED Triage Notes (Signed)
Patient arrives to ED with complaints of dental pain for the past 3-4 days. Pt states he took x20 tylenol for pain yesterday and vomited some this morning because of it. Patient here for pain medicine and antibiotics, has dental appointment x2 weeks.

## 2019-08-17 NOTE — ED Notes (Signed)
Pt called x3 for role call with no response. °

## 2019-08-17 NOTE — ED Notes (Signed)
Jordan Blanchard Darr PA notified of pt's tylenol administration. Per PA Darr pt to self transport via private vehicle to ED with girlfriend. Pt agreeable to plan. Pt's vitals stable. Pt educated about need immediate evaluation in ED for blood work.

## 2019-08-18 ENCOUNTER — Telehealth (HOSPITAL_COMMUNITY): Payer: Self-pay

## 2019-08-18 NOTE — Telephone Encounter (Signed)
Called pt to follow up for pt to come to [facility] for eval of tylenol ingestion. Pt states he is trying to get transportation and anticipates being able to arrive "soon".

## 2019-08-18 NOTE — Telephone Encounter (Signed)
Called pt to check his status after following up that pt had left LWBS after triage at [facility] ED yesterday. Pt stated he still had no appetite and some stomach pain. Pt stated he was assessed in triage at ED yesterday and notified staff that he had accidentally ingested a total of 19 pills (500 mg strength) of Tylenol in 24 hours in an attempt to relieve tooth pain. Pt stated he did not want to cause self-harm. Pt reported that ED triage staff had pt return to waiting room and told him he would be treated for dental pain, that ingestion was "not to worry about".  Pt informed this RN that he had not vomited since yesterday at approx 1630. This RN advised pt that he needed to be evaluated ASAP for tylenol side effects. RN spoke with Dr. Leonides Grills and Leretha Dykes to inform them of pt status and history of c/c. Dr. Leonides Grills advised for pt to come to [facility] for blood work and evaluation; same conveyed to pt who stated he would arrive as soon as he could get transportation; approx 2 hours.

## 2019-08-19 ENCOUNTER — Ambulatory Visit (HOSPITAL_COMMUNITY)
Admission: EM | Admit: 2019-08-19 | Discharge: 2019-08-19 | Disposition: A | Discharge status: Home / Self Care | Payer: Self-pay | Attending: Family Medicine | Admitting: Family Medicine

## 2019-08-19 ENCOUNTER — Encounter (HOSPITAL_COMMUNITY): Payer: Self-pay

## 2019-08-19 DIAGNOSIS — T391X1A Poisoning by 4-Aminophenol derivatives, accidental (unintentional), initial encounter: Secondary | ICD-10-CM | POA: Insufficient documentation

## 2019-08-19 DIAGNOSIS — K0889 Other specified disorders of teeth and supporting structures: Secondary | ICD-10-CM | POA: Insufficient documentation

## 2019-08-19 LAB — COMPREHENSIVE METABOLIC PANEL
ALT: 40 U/L (ref 0–44)
AST: 48 U/L — ABNORMAL HIGH (ref 15–41)
Albumin: 4.9 g/dL (ref 3.5–5.0)
Alkaline Phosphatase: 67 U/L (ref 38–126)
Anion gap: 12 (ref 5–15)
BUN: 10 mg/dL (ref 6–20)
CO2: 27 mmol/L (ref 22–32)
Calcium: 9.9 mg/dL (ref 8.9–10.3)
Chloride: 99 mmol/L (ref 98–111)
Creatinine, Ser: 0.91 mg/dL (ref 0.61–1.24)
GFR calc Af Amer: >60 mL/min (ref >60)
GFR calc non Af Amer: >60 mL/min (ref >60)
Glucose, Bld: 101 mg/dL — ABNORMAL HIGH (ref 70–99)
Potassium: 4 mmol/L (ref 3.5–5.1)
Sodium: 138 mmol/L (ref 135–145)
Total Bilirubin: 1.3 mg/dL — ABNORMAL HIGH (ref 0.3–1.2)
Total Protein: 8.2 g/dL — ABNORMAL HIGH (ref 6.5–8.1)

## 2019-08-19 LAB — ACETAMINOPHEN LEVEL: Acetaminophen (Tylenol), Serum: <10 ug/mL — ABNORMAL LOW (ref 10–30)

## 2019-08-19 MED ORDER — PENICILLIN V POTASSIUM 500 MG PO TABS: 500.0000 mg | ORAL_TABLET | Freq: Three times a day (TID) | ORAL | 0 refills | Status: DC

## 2019-08-19 MED ORDER — IBUPROFEN 800 MG PO TABS: 800.0000 mg | ORAL_TABLET | Freq: Three times a day (TID) | ORAL | 0 refills | Status: AC

## 2019-08-19 NOTE — ED Triage Notes (Signed)
Pt presents to UC for follow up. We called the at to return to this UC and for check out, after he took 19 Tylenol (500mg ) pills 2 days ago. Pt took the Tylenol for tooth pain. Pt states having mild left lower quadrant pain. Pt denies nausea, vomiting, tiredness, sweating, itching.

## 2019-08-19 NOTE — ED Provider Notes (Signed)
[place]   [id number] 08/19/19 Arrival Time: 8299  ASSESSMENT & PLAN:  1. Pain, dental   2. Acetaminophen overdose, accidental or unintentional, initial encounter     Without n/v/abdominal pain/lethargy/diaphoresis. No Tylenol use over the past 48 hours.  Pending: Labs Reviewed  ACETAMINOPHEN LEVEL  COMPREHENSIVE METABOLIC PANEL    No sign of abscess requiring I&D at this time. Discussed.  Meds ordered this encounter  Medications  . penicillin v potassium (VEETID) 500 MG tablet    Sig: Take 1 tablet (500 mg total) by mouth 3 (three) times daily.    Dispense:  30 tablet    Refill:  0  . ibuprofen (ADVIL) 800 MG tablet    Sig: Take 1 tablet (800 mg total) by mouth 3 (three) times daily with meals.    Dispense:  21 tablet    Refill:  0    Plans on seeing dentist in 2-3 weeks.  Reviewed expectations re: course of current medical issues. Questions answered. Outlined signs and symptoms indicating need for more acute intervention. Patient verbalized understanding. After Visit Summary given.   SUBJECTIVE:  Jordan Blanchard is a 29 y.o. male who reports taking 19 500mg  Tylenol tablets two days ago for right lower dental pain. Referred to ED from here on 08/17/2019. LWBS secondary to wait time. Tooth ache is better but would like Rx ibuprofen. Afebrile. Without n/v/abdominal pain/lethargy/diaphoresis.  Normal PO intake. No SI/desire for self harm. Overdose was unintentional.   OBJECTIVE: Vitals:   08/19/19 1140  BP: 125/75  Pulse: 83  Resp: 18  Temp: 98.2 F (36.8 C)  TempSrc: Oral  SpO2: 98%    General appearance: alert; no distress HENT: normocephalic; atraumatic; dentition: fair; right lower gums without areas of fluctuance, drainage, or bleeding and with tenderness to palpation; multiple caries; normal jaw movement without difficulty Neck: supple without LAD; FROM; trachea midline Lungs: normal respirations; unlabored; speaks full sentences without  difficulty Abd: S/NT/ND Skin: warm and dry Ext: without edema Psychological: alert and cooperative; normal mood and affect  No Known Allergies  History reviewed. No pertinent past medical history.   Social History   Socioeconomic History  . Marital status: Single    Spouse name: Not on file  . Number of children: Not on file  . Years of education: Not on file  . Highest education level: Not on file  Occupational History  . Not on file  Tobacco Use  . Smoking status: Current Every Day Smoker    Packs/day: 0.50    Types: Cigarettes  . Smokeless tobacco: Never Used  . Tobacco comment: buys singles  Vaping Use  . Vaping Use: Never used  Substance and Sexual Activity  . Alcohol use: Yes    Alcohol/week: 7.0 standard drinks    Types: 7 Cans of beer per week  . Drug use: Yes    Frequency: 3.0 times per week    Types: Marijuana  . Sexual activity: Yes    Birth control/protection: None  Other Topics Concern  . Not on file  Social History Narrative  . Not on file   Social Determinants of Health   Financial Resource Strain:   . Difficulty of Paying Living Expenses:   Food Insecurity:   . Worried About Charity fundraiser in the Last Year:   . Arboriculturist in the Last Year:   Transportation Needs:   . Film/video editor (Medical):   Marland Kitchen Lack of Transportation (Non-Medical):   Physical Activity:   .  Days of Exercise per Week:   . Minutes of Exercise per Session:   Stress:   . Feeling of Stress :   Social Connections:   . Frequency of Communication with Friends and Family:   . Frequency of Social Gatherings with Friends and Family:   . Attends Religious Services:   . Active Member of Clubs or Organizations:   . Attends Banker Meetings:   Marland Kitchen Marital Status:   Intimate Partner Violence:   . Fear of Current or Ex-Partner:   . Emotionally Abused:   Marland Kitchen Physically Abused:   . Sexually Abused:    Family History  Problem Relation Age of Onset  .  Cancer Maternal Grandmother   . Healthy Mother   . Healthy Father    History reviewed. No pertinent surgical history.   Mardella Layman, MD 08/19/19 [phone]

## 2020-08-25 ENCOUNTER — Encounter (HOSPITAL_COMMUNITY): Payer: Self-pay | Admitting: Emergency Medicine

## 2020-08-25 ENCOUNTER — Other Ambulatory Visit: Payer: Self-pay

## 2020-08-25 ENCOUNTER — Emergency Department (HOSPITAL_COMMUNITY): Payer: Self-pay

## 2020-08-25 ENCOUNTER — Emergency Department (HOSPITAL_COMMUNITY)
Admission: EM | Admit: 2020-08-25 | Discharge: 2020-08-25 | Disposition: A | Discharge status: Home / Self Care | Payer: Self-pay | Attending: Emergency Medicine | Admitting: Emergency Medicine

## 2020-08-25 DIAGNOSIS — M79641 Pain in right hand: Secondary | ICD-10-CM | POA: Insufficient documentation

## 2020-08-25 DIAGNOSIS — F17210 Nicotine dependence, cigarettes, uncomplicated: Secondary | ICD-10-CM | POA: Insufficient documentation

## 2020-08-25 DIAGNOSIS — W228XXA Striking against or struck by other objects, initial encounter: Secondary | ICD-10-CM | POA: Insufficient documentation

## 2020-08-25 NOTE — Discharge Instructions (Addendum)
You have been seen and discharged from the emergency department.  Your x-ray shows no new/acute fracture.  There was a finding of a small bony overgrowth on your second metacarpal. These are benign but can cause inflammation and pain. Follow up with orthopedics if you experience symptoms. Take Tylenol and Ibuprofen for pain control. Rest and ice the hand. If you have any worsening symptoms or further concerns for your health please return to an emergency department for further evaluation.

## 2020-08-25 NOTE — ED Triage Notes (Signed)
Pt reports punching a wall about 8 hours ago and now reporting rt hand pain. Abrasion noted to top of hand.

## 2020-08-25 NOTE — ED Provider Notes (Signed)
[facility] EMERGENCY DEPARTMENT Provider Note   CSN: [id number] Arrival date & time: 08/25/20  0645     History Chief Complaint  Patient presents with   Hand Pain    Jordan Blanchard is a 30 y.o. male.  HPI  30 year old right-hand-dominant male presents the emergency department the right hand injury.  Last night he states that he punched a hard surface, since then he has been having right hand pain.  Denies any other arm pain or injury.  He has not taken any medicine for pain relief.  He has previously injured the hand many years ago, no surgery.  History reviewed. No pertinent past medical history.  There are no problems to display for this patient.   History reviewed. No pertinent surgical history.     Family History  Problem Relation Age of Onset   Cancer Maternal Grandmother    Healthy Mother    Healthy Father     Social History   Tobacco Use   Smoking status: Every Day    Packs/day: 0.50    Pack years: 0.00    Types: Cigarettes   Smokeless tobacco: Never   Tobacco comments:    buys singles  Vaping Use   Vaping Use: Never used  Substance Use Topics   Alcohol use: Yes    Alcohol/week: 7.0 standard drinks    Types: 7 Cans of beer per week   Drug use: Not Currently    Frequency: 3.0 times per week    Types: Marijuana    Home Medications Prior to Admission medications   Medication Sig Start Date End Date Taking? Authorizing Provider  ibuprofen (ADVIL) 800 MG tablet Take 1 tablet (800 mg total) by mouth 3 (three) times daily with meals. 08/19/19   Mardella Layman, MD  penicillin v potassium (VEETID) 500 MG tablet Take 1 tablet (500 mg total) by mouth 3 (three) times daily. 08/19/19   Mardella Layman, MD    Allergies    Patient has no known allergies.  Review of Systems   Review of Systems  Constitutional:  Negative for fever.  Respiratory:  Negative for shortness of breath.   Cardiovascular:  Negative for chest pain.  Gastrointestinal:   Negative for abdominal pain.  Musculoskeletal:        + Right hand pain  Skin:  Negative for rash.  Neurological:  Negative for headaches.   Physical Exam Updated Vital Signs BP (!) 144/92 (BP Location: Left Arm)   Pulse 72   Temp 99.3 F (37.4 C) (Oral)   Resp 14   Ht 6' (1.829 m)   Wt 79.4 kg   SpO2 100%   BMI 23.73 kg/m   Physical Exam Vitals and nursing note reviewed.  Constitutional:      Appearance: Normal appearance.  HENT:     Head: Normocephalic.     Mouth/Throat:     Mouth: Mucous membranes are moist.  Cardiovascular:     Rate and Rhythm: Normal rate.  Pulmonary:     Effort: Pulmonary effort is normal. No respiratory distress.  Musculoskeletal:     Comments: Minimal right hand swelling, mild diffuse tenderness to palpation without any point tenderness, no obvious deformity, neurovascularly intact  Skin:    General: Skin is warm.  Neurological:     Mental Status: He is alert and oriented to person, place, and time. Mental status is at baseline.  Psychiatric:        Mood and Affect: Mood normal.    ED  Results / Procedures / Treatments   Labs (all labs ordered are listed, but only abnormal results are displayed) Labs Reviewed - No data to display  EKG None  Radiology DG Hand Complete Right  Result Date: 08/25/2020 CLINICAL DATA:  30 year old male punched a hard surface last night. Pain. EXAM: RIGHT HAND - COMPLETE 3+ VIEW COMPARISON:  None. FINDINGS: Bone mineralization is within normal limits. Distal radius, ulna and carpal bones. Probable healed prior 5th metacarpal fracture. Other metacarpals and phalanges appear intact and normally aligned. Normal joint spaces. However, there is a small but conspicuous 6-7 mm osteochondroma arising from the ulnar aspect of the head of the 2nd metacarpal (arrow). No discrete soft tissue abnormality. IMPRESSION: 1. No acute fracture or dislocation identified about the right hand. Probable healed 5th metacarpal fracture. 2.  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 These are generally benign but can be symptomatic due to mechanical affects of the lesion. Follow-up with Orthopedic Surgery if symptomatic. Electronically Signed   By: Odessa Fleming M.D.   On: 08/25/2020 07:47    Procedures Procedures   Medications Ordered in ED Medications - No data to display  ED Course  I have reviewed the triage vital signs and the nursing notes.  Pertinent labs & imaging results that were available during my care of the patient were reviewed by me and considered in my medical decision making (see chart for details).    MDM Rules/Calculators/A&P                          30 year old male presents emergency department with right hand injury after punching a wall last night.  X-ray shows an old fifth metacarpal fracture and a finding of osteochondroma but no acute finding/fracture.  Patient educated on osteochondroma possible side effects and need for orthopedic follow-up if the area becomes painful.  Offered Ace wrap/brace which the patient declines.  Recommended Tylenol/ibuprofen and icing the hand.  Patient will be discharged and treated as an outpatient.  Discharge plan and strict return to ED precautions discussed, patient verbalizes understanding and agreement.  Final Clinical Impression(s) / ED Diagnoses Final diagnoses:  Hand pain, right    Rx / DC Orders ED Discharge Orders     None        Rozelle Logan, DO 08/25/20 3546

## 2022-06-21 ENCOUNTER — Encounter (HOSPITAL_COMMUNITY): Payer: Self-pay

## 2022-06-21 ENCOUNTER — Ambulatory Visit (HOSPITAL_COMMUNITY)
Admission: EM | Admit: 2022-06-21 | Discharge: 2022-06-21 | Disposition: A | Discharge status: Home / Self Care | Payer: Self-pay | Attending: Emergency Medicine | Admitting: Emergency Medicine

## 2022-06-21 ENCOUNTER — Telehealth (HOSPITAL_COMMUNITY): Payer: Self-pay | Admitting: Emergency Medicine

## 2022-06-21 DIAGNOSIS — Z202 Contact with and (suspected) exposure to infections with a predominantly sexual mode of transmission: Secondary | ICD-10-CM | POA: Insufficient documentation

## 2022-06-21 LAB — HIV ANTIBODY (ROUTINE TESTING W REFLEX): HIV Screen 4th Generation wRfx: NONREACTIVE

## 2022-06-21 NOTE — ED Triage Notes (Signed)
Pt presents to the office

## 2022-06-21 NOTE — Telephone Encounter (Signed)
Telephone encounter created to order cytology swab.

## 2022-06-21 NOTE — ED Provider Notes (Signed)
[facility]    CSN: [id number] Arrival date & time: 06/21/22  1333      History   Chief Complaint Chief Complaint  Patient presents with   SEXUALLY TRANSMITTED DISEASE    HPI Jordan Blanchard is a 32 y.o. male.   Patient reports his sexual partner texted him and said she tested positive for sexually transmitted infection, did not clarify which one.  He presents to clinic for sexually transmitted disease screening.  He denies any symptoms, no penile lesions, no dysuria, no penile discharge.  Would like HIV and syphilis screening.   The history is provided by the patient and medical records.    History reviewed. No pertinent past medical history.  There are no problems to display for this patient.   History reviewed. No pertinent surgical history.     Home Medications    Prior to Admission medications   Medication Sig Start Date End Date Taking? Authorizing Provider  ibuprofen (ADVIL) 800 MG tablet Take 1 tablet (800 mg total) by mouth 3 (three) times daily with meals. 08/19/19   Mardella Layman, MD  penicillin v potassium (VEETID) 500 MG tablet Take 1 tablet (500 mg total) by mouth 3 (three) times daily. 08/19/19   Mardella Layman, MD    Family History Family History  Problem Relation Age of Onset   Cancer Maternal Grandmother    Healthy Mother    Healthy Father     Social History Social History   Tobacco Use   Smoking status: Every Day    Packs/day: .5    Types: Cigarettes   Smokeless tobacco: Never   Tobacco comments:    buys singles  Vaping Use   Vaping Use: Never used  Substance Use Topics   Alcohol use: Yes    Alcohol/week: 7.0 standard drinks of alcohol    Types: 7 Cans of beer per week   Drug use: Not Currently    Frequency: 3.0 times per week    Types: Marijuana     Allergies   Patient has no known allergies.   Review of Systems Review of Systems  Constitutional:  Negative for chills and fever.  Genitourinary:  Negative for  dysuria, genital sores, penile discharge, penile pain and penile swelling.     Physical Exam Triage Vital Signs ED Triage Vitals [06/21/22 1438]  Enc Vitals Group     BP 122/81     Pulse      Resp 18     Temp 98.1 F (36.7 C)     Temp Source Oral     SpO2 98 %     Weight      Height      Head Circumference      Peak Flow      Pain Score      Pain Loc      Pain Edu?      Excl. in GC?    No data found.  Updated Vital Signs BP 122/81 (BP Location: Left Arm)   Temp 98.1 F (36.7 C) (Oral)   Resp 18   SpO2 98%   Visual Acuity Right Eye Distance:   Left Eye Distance:   Bilateral Distance:    Right Eye Near:   Left Eye Near:    Bilateral Near:     Physical Exam Vitals and nursing note reviewed.  Constitutional:      Appearance: Normal appearance.  HENT:     Head: Normocephalic and atraumatic.     Right Ear:  External ear normal.     Left Ear: External ear normal.     Nose: Nose normal.     Mouth/Throat:     Mouth: Mucous membranes are moist.  Eyes:     Conjunctiva/sclera: Conjunctivae normal.  Cardiovascular:     Rate and Rhythm: Normal rate and regular rhythm.  Pulmonary:     Effort: Pulmonary effort is normal. No respiratory distress.  Neurological:     General: No focal deficit present.     Mental Status: He is alert and oriented to person, place, and time.  Psychiatric:        Mood and Affect: Mood normal.        Behavior: Behavior normal.      UC Treatments / Results  Labs (all labs ordered are listed, but only abnormal results are displayed) Labs Reviewed  RPR  HIV ANTIBODY (ROUTINE TESTING W REFLEX)    EKG   Radiology No results found.  Procedures Procedures (including critical care time)  Medications Ordered in UC Medications - No data to display  Initial Impression / Assessment and Plan / UC Course  I have reviewed the triage vital signs and the nursing notes.  Pertinent labs & imaging results that were available during my  care of the patient were reviewed by me and considered in my medical decision making (see chart for details).  Vitals and triage reviewed, patient is hemodynamically stable.  Reports exposure to sexually transmitted infection, unsure which disease.  Discussed we no longer prophylactically treat for all STIs, we will wait on cytology testing and notify if positive to start appropriate treatment.  Patient verbalized understanding, no questions at this time.    Final Clinical Impressions(s) / UC Diagnoses   Final diagnoses:  Exposure to sexually transmitted disease (STD)     Discharge Instructions      We have screened you for sexually transmitted infections today including HIV and syphilis.  We will contact you if the results are positive and initiate the appropriate treatment.  Please abstain from intercourse until all results are received, if positive, please notify any sexual partners.  Please return to clinic for any new or concerning symptoms.     ED Prescriptions   None    PDMP not reviewed this encounter.   Gerrad Welker, [place], [place] 06/21/22 [phone]

## 2022-06-21 NOTE — ED Triage Notes (Signed)
Pt presents to the office for STD-testing. Pt stated he received a phone for his partner. Denies any symptoms at this time.

## 2022-06-21 NOTE — Discharge Instructions (Addendum)
We have screened you for sexually transmitted infections today including HIV and syphilis.  We will contact you if the results are positive and initiate the appropriate treatment.  Please abstain from intercourse until all results are received, if positive, please notify any sexual partners.  Please return to clinic for any new or concerning symptoms.

## 2022-06-22 LAB — RPR: RPR Ser Ql: NONREACTIVE

## 2022-06-24 LAB — CYTOLOGY, (ORAL, ANAL, URETHRAL) ANCILLARY ONLY
Chlamydia: NEGATIVE
Comment: NEGATIVE
Comment: NEGATIVE
Comment: NORMAL
Neisseria Gonorrhea: NEGATIVE
Trichomonas: POSITIVE — AB

## 2022-06-25 ENCOUNTER — Telehealth (HOSPITAL_COMMUNITY): Payer: Self-pay | Admitting: Emergency Medicine

## 2022-06-25 MED ORDER — METRONIDAZOLE 500 MG PO TABS: 2000.0000 mg | ORAL_TABLET | Freq: Once | ORAL | 0 refills | Status: AC

## 2022-09-03 ENCOUNTER — Encounter (HOSPITAL_COMMUNITY): Payer: Self-pay | Admitting: Emergency Medicine

## 2022-09-03 ENCOUNTER — Ambulatory Visit (HOSPITAL_COMMUNITY)
Admission: EM | Admit: 2022-09-03 | Discharge: 2022-09-03 | Disposition: A | Discharge status: Home / Self Care | Payer: Self-pay | Attending: Family Medicine | Admitting: Family Medicine

## 2022-09-03 DIAGNOSIS — Z113 Encounter for screening for infections with a predominantly sexual mode of transmission: Secondary | ICD-10-CM | POA: Insufficient documentation

## 2022-09-03 NOTE — Discharge Instructions (Signed)
We have sent testing for sexually transmitted infections. We will notify you of any positive results once they are received. If required, we will prescribe any medications you might need.  Please refrain from all sexual activity for at least the next seven days.  

## 2022-09-03 NOTE — ED Provider Notes (Signed)
  [facility]   [id number] 09/03/22 Arrival Time: 1557  ASSESSMENT & PLAN:  1. Screening for STDs (sexually transmitted diseases)    No symptoms.     Discharge Instructions      We have sent testing for sexually transmitted infections. We will notify you of any positive results once they are received. If required, we will prescribe any medications you might need.  Please refrain from all sexual activity for at least the next seven days.     Pending: Labs Reviewed  CYTOLOGY, (ORAL, ANAL, URETHRAL) ANCILLARY ONLY    Will notify of any positive results. Instructed to refrain from sexual activity for at least seven days.  Reviewed expectations re: course of current medical issues. Questions answered. Outlined signs and symptoms indicating need for more acute intervention. Patient verbalized understanding. After Visit Summary given.   SUBJECTIVE:  Jordan Blanchard is a 32 y.o. male who requests std screening. No symptoms.  OBJECTIVE:  Vitals:   09/03/22 1708  BP: 126/78  Pulse: 63  Resp: 14  Temp: 98.2 F (36.8 C)  SpO2: 97%     General appearance: alert, cooperative, appears stated age and no distress Psychological: alert and cooperative; normal mood and affect.    Labs Reviewed  CYTOLOGY, (ORAL, ANAL, URETHRAL) ANCILLARY ONLY    No Known Allergies  History reviewed. No pertinent past medical history. Family History  Problem Relation Age of Onset   Cancer Maternal Grandmother    Healthy Mother    Healthy Father    Social History   Socioeconomic History   Marital status: Single    Spouse name: Not on file   Number of children: Not on file   Years of education: Not on file   Highest education level: Not on file  Occupational History   Not on file  Tobacco Use   Smoking status: Every Day    Packs/day: .5    Types: Cigarettes   Smokeless tobacco: Never   Tobacco comments:    buys singles  Vaping Use   Vaping Use: Never used   Substance and Sexual Activity   Alcohol use: Yes    Alcohol/week: 7.0 standard drinks of alcohol    Types: 7 Cans of beer per week   Drug use: Not Currently    Frequency: 3.0 times per week    Types: Marijuana   Sexual activity: Yes    Birth control/protection: None  Other Topics Concern   Not on file  Social History Narrative   Not on file   Social Determinants of Health   Financial Resource Strain: Not on file  Food Insecurity: Not on file  Transportation Needs: Not on file  Physical Activity: Not on file  Stress: Not on file  Social Connections: Not on file  Intimate Partner Violence: Not on file           [place], MD 09/03/22 1747

## 2022-09-03 NOTE — ED Triage Notes (Signed)
Pt wanting STD testing. Report recently treated for Trich. Reports partner has BV and just wants STD testing to make sure all is good. Denies any s/s.

## 2022-09-04 LAB — CYTOLOGY, (ORAL, ANAL, URETHRAL) ANCILLARY ONLY
Chlamydia: NEGATIVE
Comment: NEGATIVE
Comment: NEGATIVE
Comment: NORMAL
Neisseria Gonorrhea: NEGATIVE
Trichomonas: NEGATIVE

## 2022-10-07 IMAGING — CR DG HAND COMPLETE 3+V*R*
3 series · 3 of 3 positions shown · non-contrast
Comparison: None.

CLINICAL DATA: 29-year-old male punched a hard surface last night.
Pain.

EXAM:
RIGHT HAND - COMPLETE 3+ VIEW

[hand pa]
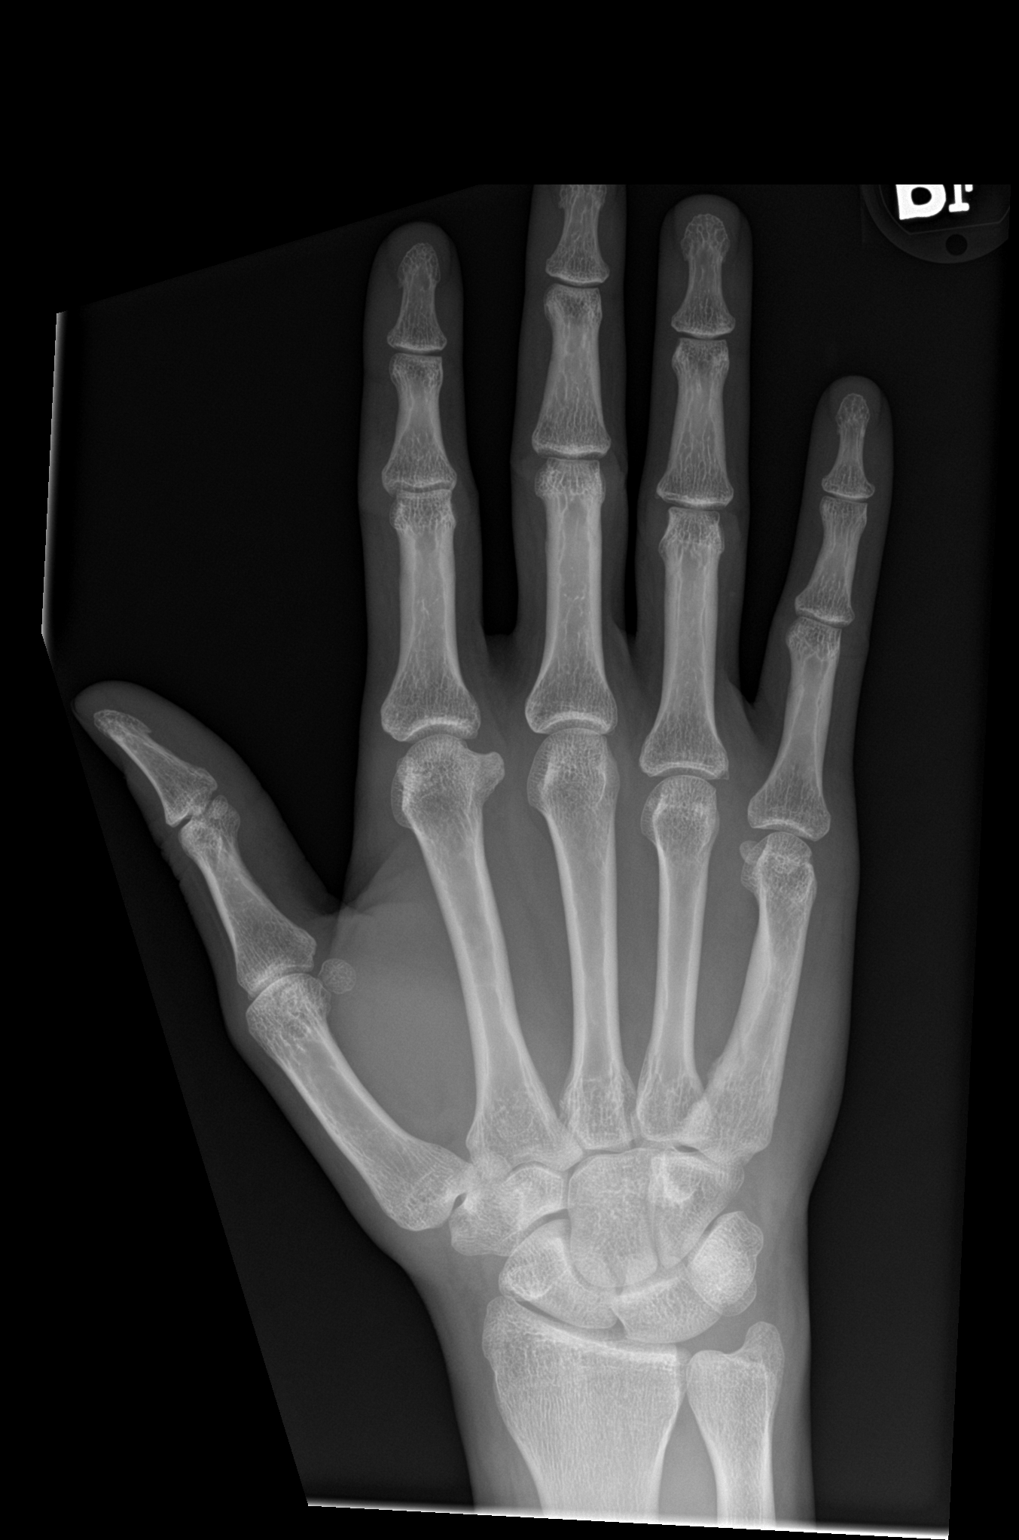

[hand obl]
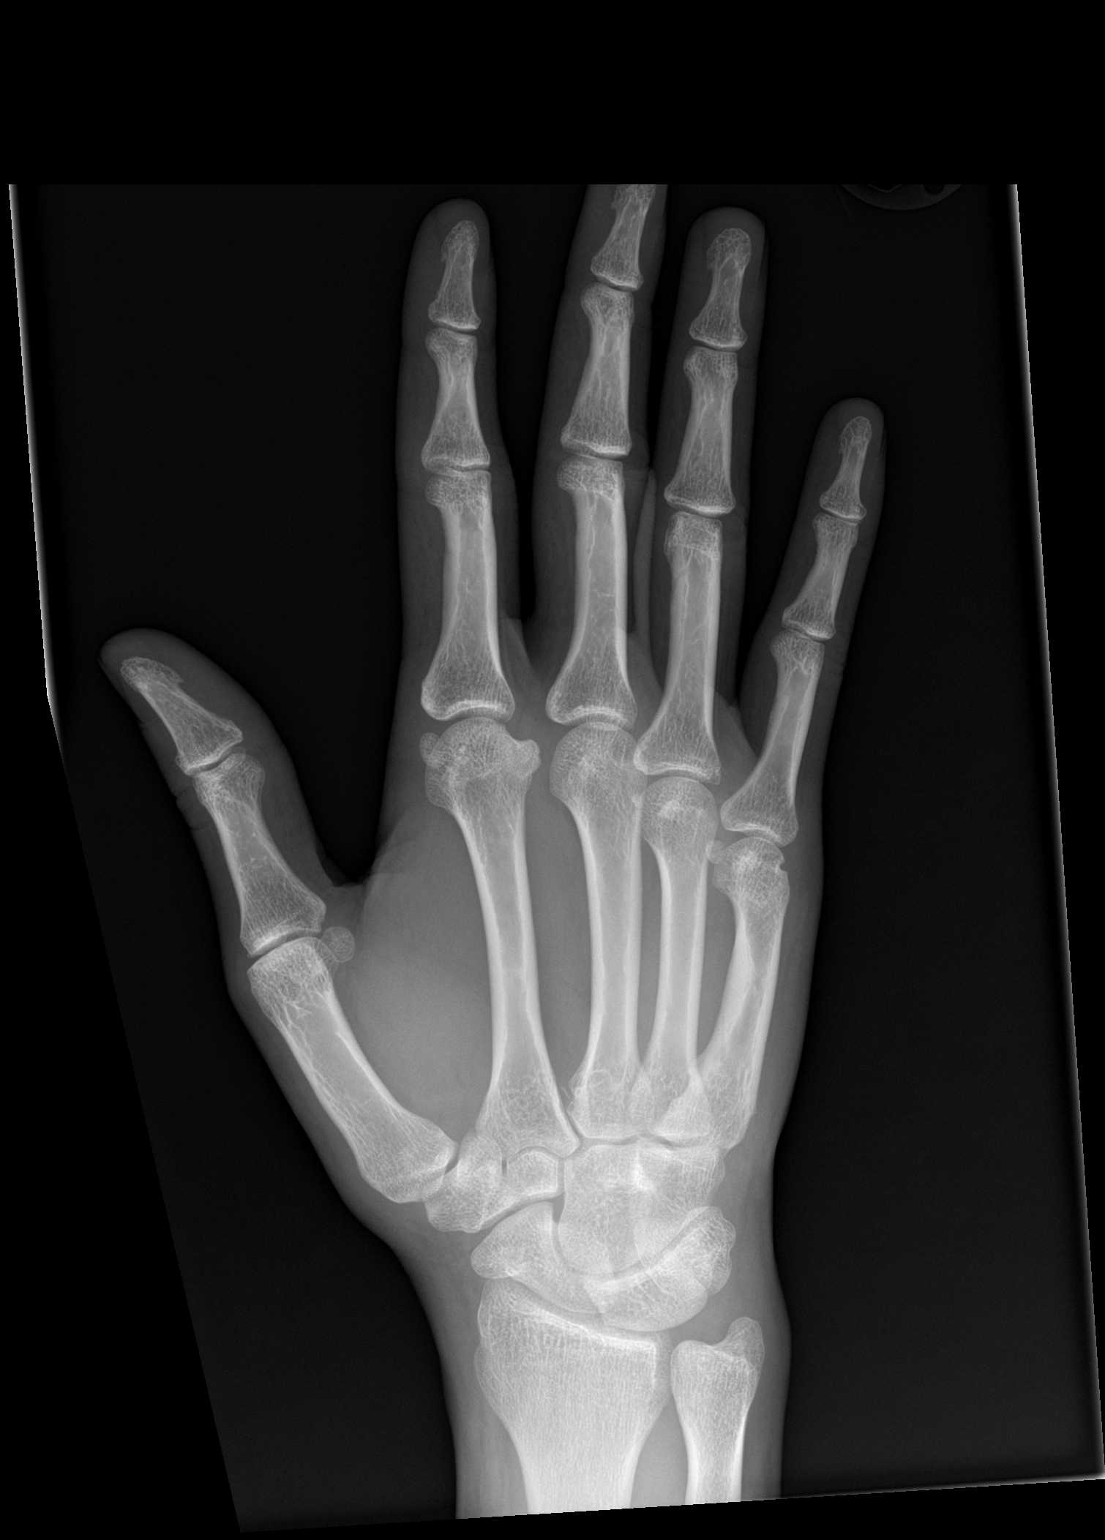

[hand lat]
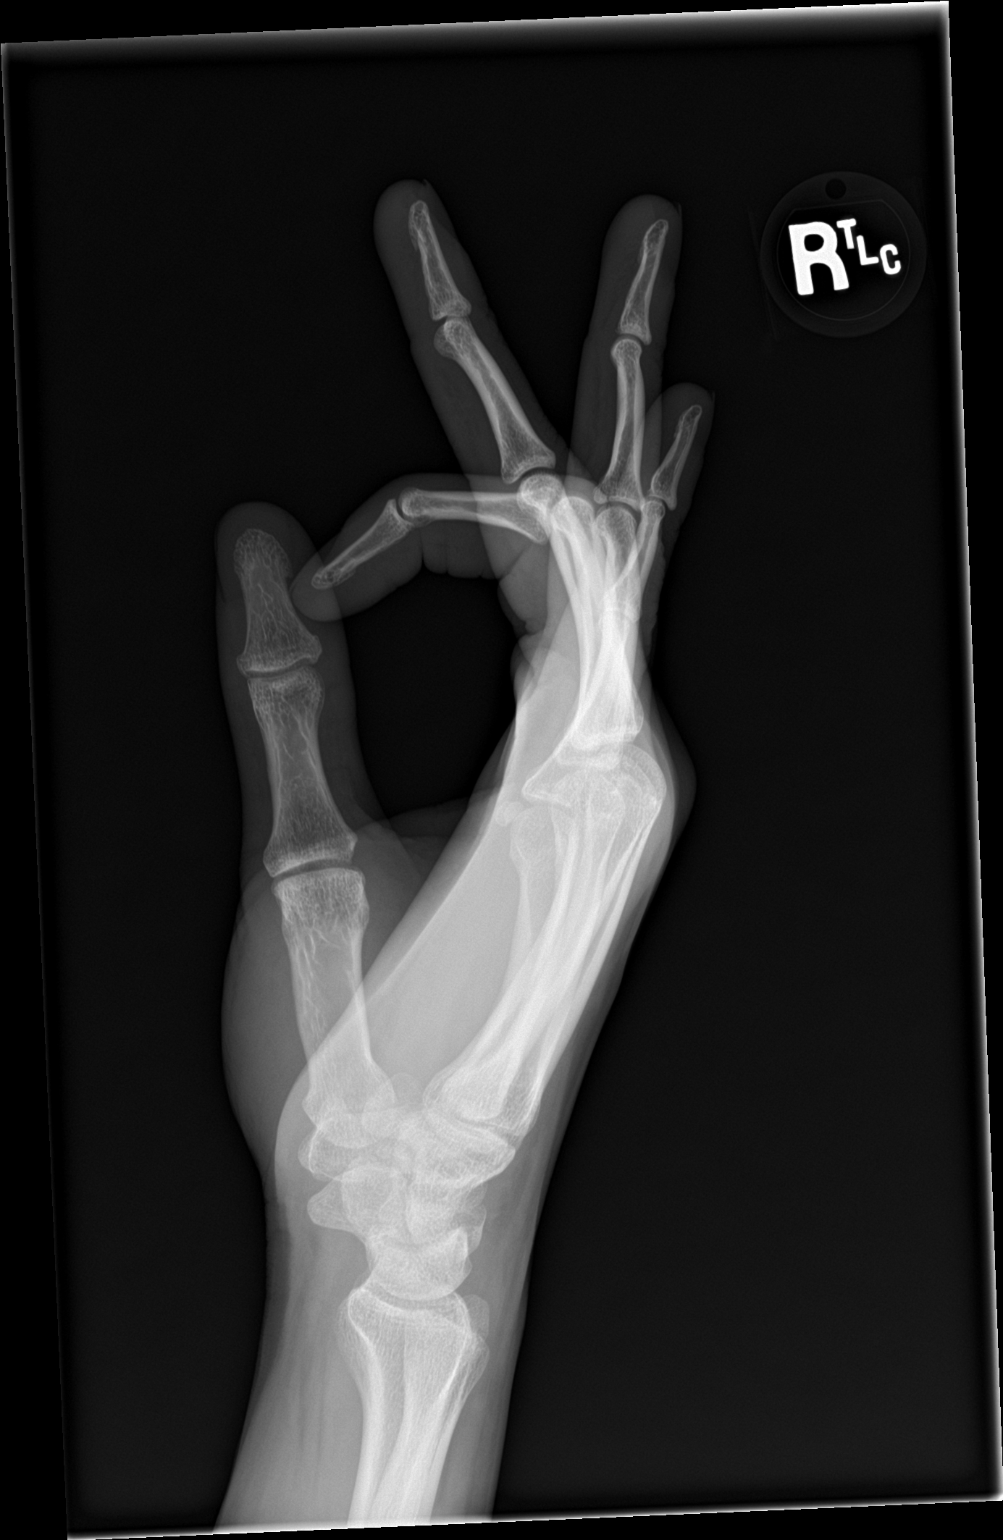

[3 of 3 positions shown; findings below may reference images not displayed]

FINDINGS: Bone mineralization is within normal limits. Distal radius, ulna and
carpal bones. Probable healed prior 5th metacarpal fracture. Other
metacarpals and phalanges appear intact and normally aligned. Normal
joint spaces.

However, there is a small but conspicuous 6-7 mm osteochondroma
arising from the ulnar aspect of the head of the 2nd metacarpal
(arrow).

No discrete soft tissue abnormality.
IMPRESSION: 1. No acute fracture or dislocation identified about the right hand.
Probable healed 5th metacarpal fracture.

2. Osteochondroma arising from the head of the 2nd metacarpal. These
are generally benign but can be symptomatic due to mechanical
affects of the lesion. Follow-up with Orthopedic Surgery if
symptomatic.

## 2023-10-17 ENCOUNTER — Ambulatory Visit (HOSPITAL_COMMUNITY)
Admission: EM | Admit: 2023-10-17 | Discharge: 2023-10-17 | Disposition: A | Discharge status: Home / Self Care | Payer: Self-pay | Attending: Family Medicine | Admitting: Family Medicine

## 2023-10-17 ENCOUNTER — Encounter (HOSPITAL_COMMUNITY): Payer: Self-pay

## 2023-10-17 DIAGNOSIS — B084 Enteroviral vesicular stomatitis with exanthem: Secondary | ICD-10-CM

## 2023-10-17 NOTE — ED Triage Notes (Signed)
 Patient states he has had red dots all over his Hands x 3 days. Patient states his girlfriends daughter had Hand, foot and mouth.

## 2023-10-17 NOTE — Discharge Instructions (Signed)
 You were seen today for Hand Foot and Mouth disease.  I have given you information on this.  I advise you to stay out of work until Monday.  You may take tylenol /motrin  for any pain if needed.

## 2023-10-17 NOTE — ED Provider Notes (Signed)
 [facility]    CSN: [id number] Arrival date & time: 10/17/23  9147      History   Chief Complaint No chief complaint on file.   HPI Jordan Blanchard is a 33 y.o. male.   Patient was exposed to hand, foot and mouth.  He has noted red dots on the palms of his hands.  Noted that about 4 days ago.   He had one on his tongue, none on his feet.  No fevers/chills.        History reviewed. No pertinent past medical history.  There are no active problems to display for this patient.   History reviewed. No pertinent surgical history.     Home Medications    Prior to Admission medications   Medication Sig Start Date End Date Taking? Authorizing Provider  ibuprofen  (ADVIL ) 800 MG tablet Take 1 tablet (800 mg total) by mouth 3 (three) times daily with meals. Patient not taking: Reported on 10/17/2023 08/19/19   Rolinda Rogue, MD    Family History Family History  Problem Relation Age of Onset   Cancer Maternal Grandmother    Healthy Mother    Healthy Father     Social History Social History   Tobacco Use   Smoking status: Every Day    Current packs/day: 0.50    Types: Cigarettes   Smokeless tobacco: Never   Tobacco comments:    buys singles  Vaping Use   Vaping status: Never Used  Substance Use Topics   Alcohol use: Yes    Alcohol/week: 7.0 standard drinks of alcohol    Types: 7 Cans of beer per week   Drug use: Not Currently    Frequency: 3.0 times per week    Types: Marijuana     Allergies   Patient has no known allergies.   Review of Systems Review of Systems  Constitutional: Negative.   HENT: Negative.    Respiratory: Negative.    Cardiovascular: Negative.   Gastrointestinal: Negative.   Genitourinary: Negative.   Musculoskeletal: Negative.   Skin:  Positive for rash.  Psychiatric/Behavioral: Negative.       Physical Exam Triage Vital Signs ED Triage Vitals [10/17/23 0939]  Encounter Vitals Group     BP 130/78     Girls  Systolic BP Percentile      Girls Diastolic BP Percentile      Boys Systolic BP Percentile      Boys Diastolic BP Percentile      Pulse Rate (!) 52     Resp 14     Temp 97.7 F (36.5 C)     Temp Source Oral     SpO2 96 %     Weight      Height      Head Circumference      Peak Flow      Pain Score      Pain Loc      Pain Education      Exclude from Growth Chart    No data found.  Updated Vital Signs BP 130/78 (BP Location: Right Arm)   Pulse (!) 52   Temp 97.7 F (36.5 C) (Oral)   Resp 14   SpO2 96%   Visual Acuity Right Eye Distance:   Left Eye Distance:   Bilateral Distance:    Right Eye Near:   Left Eye Near:    Bilateral Near:     Physical Exam Constitutional:      General: He is not in  acute distress.    Appearance: Normal appearance. He is normal weight. He is not ill-appearing or toxic-appearing.  Cardiovascular:     Rate and Rhythm: Normal rate and regular rhythm.  Pulmonary:     Effort: Pulmonary effort is normal.     Breath sounds: Normal breath sounds.  Skin:    Comments: Mild rash noted to the palms of the hands bilaterally;   One lesion to the side of the tongue on the left;  No other rashes noted  Neurological:     Mental Status: He is alert.      UC Treatments / Results  Labs (all labs ordered are listed, but only abnormal results are displayed) Labs Reviewed - No data to display  EKG   Radiology No results found.  Procedures Procedures (including critical care time)  Medications Ordered in UC Medications - No data to display  Initial Impression / Assessment and Plan / UC Course  I have reviewed the triage vital signs and the nursing notes.  Pertinent labs & imaging results that were available during my care of the patient were reviewed by me and considered in my medical decision making (see chart for details).   Final Clinical Impressions(s) / UC Diagnoses   Final diagnoses:  Hand, foot and mouth disease      Discharge Instructions      You were seen today for Hand Foot and Mouth disease.  I have given you information on this.  I advise you to stay out of work until Monday.  You may take tylenol /motrin  for any pain if needed.     ED Prescriptions   None    PDMP not reviewed this encounter.   Darral Longs, MD 10/17/23 [phone]

## 2023-10-21 ENCOUNTER — Encounter (HOSPITAL_COMMUNITY): Payer: Self-pay

## 2023-10-21 ENCOUNTER — Ambulatory Visit (HOSPITAL_COMMUNITY): Admission: EM | Admit: 2023-10-21 | Discharge: 2023-10-21 | Discharge status: Against Medical Advice | Payer: Self-pay

## 2023-10-21 ENCOUNTER — Ambulatory Visit (HOSPITAL_COMMUNITY)
Admission: EM | Admit: 2023-10-21 | Discharge: 2023-10-21 | Disposition: A | Discharge status: Home / Self Care | Payer: Self-pay

## 2023-10-21 DIAGNOSIS — R1030 Lower abdominal pain, unspecified: Secondary | ICD-10-CM

## 2023-10-21 DIAGNOSIS — R197 Diarrhea, unspecified: Secondary | ICD-10-CM

## 2023-10-21 NOTE — ED Provider Notes (Signed)
 [facility]    CSN: [id number] Arrival date & time: 10/21/23  1243      History   Chief Complaint Chief Complaint  Patient presents with   Abdominal Pain    HPI Jordan Blanchard is a 33 y.o. male.   Patient presents with lower abdominal pain that began upon waking this morning.  Patient states that he had 1 episode of diarrhea and the abdominal pain resolved after this.  Patient states that he is no longer having abdominal pain nor the urge to have diarrhea.  Patient also denies nausea, vomiting, fever, weakness, or blood in stool.  Patient also denies dysuria, hematuria, urinary frequency/urgency, penile discharge, penile/testicular pain or swelling, and flank pain.  Patient denies any new medications or new foods.  The history is provided by the patient and medical records.  Abdominal Pain   History reviewed. No pertinent past medical history.  There are no active problems to display for this patient.   History reviewed. No pertinent surgical history.     Home Medications    Prior to Admission medications   Medication Sig Start Date End Date Taking? Authorizing Provider  ibuprofen  (ADVIL ) 800 MG tablet Take 1 tablet (800 mg total) by mouth 3 (three) times daily with meals. Patient not taking: Reported on 10/17/2023 08/19/19   Rolinda Rogue, MD    Family History Family History  Problem Relation Age of Onset   Cancer Maternal Grandmother    Healthy Mother    Healthy Father     Social History Social History   Tobacco Use   Smoking status: Every Day    Current packs/day: 0.50    Types: Cigarettes   Smokeless tobacco: Never   Tobacco comments:    buys singles  Vaping Use   Vaping status: Never Used  Substance Use Topics   Alcohol use: Yes    Alcohol/week: 7.0 standard drinks of alcohol    Types: 7 Cans of beer per week   Drug use: Not Currently    Frequency: 3.0 times per week    Types: Marijuana     Allergies   Patient has no known  allergies.   Review of Systems Review of Systems  Gastrointestinal:  Positive for abdominal pain.   Per HPI  Physical Exam Triage Vital Signs ED Triage Vitals  Encounter Vitals Group     BP 10/21/23 1428 118/70     Girls Systolic BP Percentile --      Girls Diastolic BP Percentile --      Boys Systolic BP Percentile --      Boys Diastolic BP Percentile --      Pulse Rate 10/21/23 1426 (!) 58     Resp 10/21/23 1426 16     Temp 10/21/23 1426 97.9 F (36.6 C)     Temp Source 10/21/23 1426 Oral     SpO2 10/21/23 1426 97 %     Weight --      Height --      Head Circumference --      Peak Flow --      Pain Score 10/21/23 1427 2     Pain Loc --      Pain Education --      Exclude from Growth Chart --    No data found.  Updated Vital Signs BP 118/70   Pulse (!) 58   Temp 97.9 F (36.6 C) (Oral)   Resp 16   SpO2 97%   Visual Acuity Right Eye  Distance:   Left Eye Distance:   Bilateral Distance:    Right Eye Near:   Left Eye Near:    Bilateral Near:     Physical Exam Vitals and nursing note reviewed.  Constitutional:      General: He is awake. He is not in acute distress.    Appearance: Normal appearance. He is well-developed and well-groomed. He is not ill-appearing.  Abdominal:     General: Abdomen is flat. Bowel sounds are normal.     Palpations: Abdomen is soft.     Tenderness: There is no abdominal tenderness.  Skin:    General: Skin is warm and dry.  Neurological:     Mental Status: He is alert.  Psychiatric:        Behavior: Behavior is cooperative.      UC Treatments / Results  Labs (all labs ordered are listed, but only abnormal results are displayed) Labs Reviewed - No data to display  EKG   Radiology No results found.  Procedures Procedures (including critical care time)  Medications Ordered in UC Medications - No data to display  Initial Impression / Assessment and Plan / UC Course  I have reviewed the triage vital signs and  the nursing notes.  Pertinent labs & imaging results that were available during my care of the patient were reviewed by me and considered in my medical decision making (see chart for details).     Patient is overall well-appearing.  Vitals are stable.  No significant findings upon exam.  Abdomen is flat, soft, and nontender.  Bowel sounds are normal.  Discussed follow-up, return, and strict ER precautions. Final Clinical Impressions(s) / UC Diagnoses   Final diagnoses:  Lower abdominal pain  Diarrhea, unspecified type     Discharge Instructions      As discussed your exam is overall reassuring today. If you develop severe abdominal pain, excessive vomiting, excessive diarrhea, fever, and weakness please seek immediate medical treatment in the emergency department. Follow-up with your primary care provider or return here as needed.   ED Prescriptions   None    PDMP not reviewed this encounter.   Johnie Flaming A, NP 10/21/23 1454

## 2023-10-21 NOTE — Discharge Instructions (Signed)
 As discussed your exam is overall reassuring today. If you develop severe abdominal pain, excessive vomiting, excessive diarrhea, fever, and weakness please seek immediate medical treatment in the emergency department. Follow-up with your primary care provider or return here as needed.

## 2023-10-21 NOTE — ED Notes (Signed)
No answer from lobby  

## 2023-10-21 NOTE — ED Triage Notes (Signed)
 Pt states lower abdominal pain since this morning.  States he had one episode of diarrhea as well.

## 2023-10-21 NOTE — ED Notes (Signed)
 No answer

## 2023-10-22 ENCOUNTER — Emergency Department (HOSPITAL_COMMUNITY): Payer: Self-pay

## 2023-10-22 ENCOUNTER — Other Ambulatory Visit: Payer: Self-pay

## 2023-10-22 ENCOUNTER — Encounter (HOSPITAL_COMMUNITY): Payer: Self-pay

## 2023-10-22 ENCOUNTER — Emergency Department (HOSPITAL_COMMUNITY)
Admission: EM | Admit: 2023-10-22 | Discharge: 2023-10-23 | Disposition: A | Discharge status: Home / Self Care | Payer: Self-pay | Attending: Emergency Medicine | Admitting: Emergency Medicine

## 2023-10-22 DIAGNOSIS — F17200 Nicotine dependence, unspecified, uncomplicated: Secondary | ICD-10-CM | POA: Insufficient documentation

## 2023-10-22 DIAGNOSIS — N452 Orchitis: Secondary | ICD-10-CM | POA: Insufficient documentation

## 2023-10-22 LAB — URINALYSIS, ROUTINE W REFLEX MICROSCOPIC
Bilirubin Urine: NEGATIVE
Glucose, UA: NEGATIVE mg/dL
Hgb urine dipstick: NEGATIVE
Ketones, ur: NEGATIVE mg/dL
Leukocytes,Ua: NEGATIVE
Nitrite: NEGATIVE
Protein, ur: NEGATIVE mg/dL
Specific Gravity, Urine: 1.023 (ref 1.005–1.030)
pH: 7 (ref 5.0–8.0)

## 2023-10-22 LAB — COMPREHENSIVE METABOLIC PANEL WITH GFR
ALT: 20 U/L (ref 0–44)
AST: 23 U/L (ref 15–41)
Albumin: 4.2 g/dL (ref 3.5–5.0)
Alkaline Phosphatase: 70 U/L (ref 38–126)
Anion gap: 10 (ref 5–15)
BUN: 6 mg/dL (ref 6–20)
CO2: 27 mmol/L (ref 22–32)
Calcium: 9.4 mg/dL (ref 8.9–10.3)
Chloride: 104 mmol/L (ref 98–111)
Creatinine, Ser: 1.04 mg/dL (ref 0.61–1.24)
GFR, Estimated: >60 mL/min (ref >60)
Glucose, Bld: 83 mg/dL (ref 70–99)
Potassium: 4.1 mmol/L (ref 3.5–5.1)
Sodium: 141 mmol/L (ref 135–145)
Total Bilirubin: 0.7 mg/dL (ref 0.0–1.2)
Total Protein: 7.2 g/dL (ref 6.5–8.1)

## 2023-10-22 LAB — CBC
HCT: 42.2 % (ref 39.0–52.0)
Hemoglobin: 14.3 g/dL (ref 13.0–17.0)
MCH: 32.2 pg (ref 26.0–34.0)
MCHC: 33.9 g/dL (ref 30.0–36.0)
MCV: 95 fL (ref 80.0–100.0)
Platelets: 274 K/uL (ref 150–400)
RBC: 4.44 MIL/uL (ref 4.22–5.81)
RDW: 12.2 % (ref 11.5–15.5)
WBC: 8.5 K/uL (ref 4.0–10.5)
nRBC: 0 % (ref 0.0–0.2)

## 2023-10-22 LAB — LIPASE, BLOOD: Lipase: 33 U/L (ref 11–51)

## 2023-10-22 NOTE — ED Triage Notes (Signed)
 Patient complains of LLQ pain that started yesterday with some diarrhea. Went to UC and was told it was something he ate but reports pain is getting worse. Denies n/v

## 2023-10-22 NOTE — ED Provider Triage Note (Signed)
 Emergency Medicine Provider Triage Evaluation Note  Jordan Blanchard , a 33 y.o. male  was evaluated in triage.  Pt complains of abdominal versus pelvic pain.  Patient states that he was seen in urgent care earlier today and they thought it was possibly something he ate but the pain has gotten worse.  He states now that he is also having some testicular pain as well worse on the left side.  Denies nausea or vomiting.  Denies trauma/injury.  Denies fever or chills.  Review of Systems  Positive: Abdominal pain, testicular pain Negative: Fever, chills, chest pain, shortness of breath, nausea, vomiting  Physical Exam  BP 128/78 (BP Location: Right Arm)   Pulse 68   Temp 98.6 F (37 C)   Resp 16   Ht 5' 11 (1.803 m)   Wt 74.8 kg   SpO2 98%   BMI 23.01 kg/m  Gen:   Awake, no distress   Resp:  Normal effort  MSK:   Moves extremities without difficulty  Other:  Abdomen nontender, soft, nondistended, no obvious abnormality with testicular examination in triage, cremasteric reflex intact  Medical Decision Making  Medically screening exam initiated at 7:31 PM.  Appropriate orders placed.  Jordan Blanchard was informed that the remainder of the evaluation will be completed by another provider, this initial triage assessment does not replace that evaluation, and the importance of remaining in the ED until their evaluation is complete.  Orders: CBC, CMP, urinalysis, lipase, scrotal ultrasound, gonorrhea/chlamydia  Patient was offered HIV testing and declined   Janetta Terrall [place], [place] 10/22/23 1933

## 2023-10-23 MED ORDER — CEFTRIAXONE SODIUM 500 MG IJ SOLR
500.0000 mg | Freq: Once | INTRAMUSCULAR | Status: AC
  Administered 2023-10-23: 500 mg via INTRAMUSCULAR
  Filled 2023-10-23: qty 500

## 2023-10-23 MED ORDER — LIDOCAINE HCL (PF) 1 % IJ SOLN
1.0000 mL | Freq: Once | INTRAMUSCULAR | Status: AC
  Administered 2023-10-23: 1 mL
  Filled 2023-10-23: qty 5

## 2023-10-23 MED ORDER — DOXYCYCLINE HYCLATE 100 MG PO TABS
100.0000 mg | ORAL_TABLET | Freq: Once | ORAL | Status: AC
  Administered 2023-10-23: 100 mg via ORAL
  Filled 2023-10-23: qty 1

## 2023-10-23 MED ORDER — DOXYCYCLINE HYCLATE 100 MG PO TABS: 100.0000 mg | ORAL_TABLET | Freq: Two times a day (BID) | ORAL | 0 refills | Status: AC

## 2023-10-23 NOTE — Discharge Instructions (Addendum)
 Your workup this evening was consistent with orchitis.  I prescribed antibiotic to be taken as directed.  Please follow-up with urology if you continue to have left-sided testicular pain.  If you develop any life-threatening conditions such as severe testicular pain return to the emergency department.

## 2023-10-23 NOTE — ED Provider Notes (Signed)
 [place] EMERGENCY DEPARTMENT AT [facility] Provider Note   CSN: [id number] Arrival date & time: 10/22/23  8182     Patient presents with: Abdominal Pain   Jordan Blanchard is a 33 y.o. male.  Patient with no relevant past medical history presents emergency room complaining of 2 days of left lower quadrant abdominal pain.  He states that pain initially began in the testicular region.  He went to urgent care yesterday who felt that he might have some sort of foodborne illness as patient did have an episode of diarrhea but patient continues to endorse tenderness in the groin/left lower quadrant region.  He denies dysuria, penile discharge.    Abdominal Pain      Prior to Admission medications   Medication Sig Start Date End Date Taking? Authorizing Provider  doxycycline  (VIBRA -TABS) 100 MG tablet Take 1 tablet (100 mg total) by mouth 2 (two) times daily. 10/23/23  Yes Logan Ubaldo NOVAK, PA-C  ibuprofen  (ADVIL ) 800 MG tablet Take 1 tablet (800 mg total) by mouth 3 (three) times daily with meals. Patient not taking: Reported on 10/17/2023 08/19/19   Rolinda Rogue, MD    Allergies: Patient has no known allergies.    Review of Systems  Gastrointestinal:  Positive for abdominal pain.    Updated Vital Signs BP 114/76 (BP Location: Right Arm)   Pulse (!) 52   Temp 98.1 F (36.7 C) (Oral)   Resp 16   Ht 5' 11 (1.803 m)   Wt 74.8 kg   SpO2 100%   BMI 23.01 kg/m   Physical Exam Vitals and nursing note reviewed. Exam conducted with a chaperone present.  Constitutional:      General: He is not in acute distress.    Appearance: He is well-developed.  HENT:     Head: Normocephalic and atraumatic.  Eyes:     Conjunctiva/sclera: Conjunctivae normal.  Cardiovascular:     Rate and Rhythm: Normal rate and regular rhythm.     Heart sounds: No murmur heard. Pulmonary:     Effort: Pulmonary effort is normal. No respiratory distress.     Breath sounds: Normal breath sounds.   Abdominal:     Palpations: Abdomen is soft.     Tenderness: There is no abdominal tenderness.  Genitourinary:    Testes: Cremasteric reflex is present.        Right: Mass, tenderness or swelling not present.        Left: Tenderness present. Mass or swelling not present.  Musculoskeletal:        General: No swelling.     Cervical back: Neck supple.  Skin:    General: Skin is warm and dry.     Capillary Refill: Capillary refill takes less than 2 seconds.  Neurological:     Mental Status: He is alert.  Psychiatric:        Mood and Affect: Mood normal.     (all labs ordered are listed, but only abnormal results are displayed) Labs Reviewed  LIPASE, BLOOD  COMPREHENSIVE METABOLIC PANEL WITH GFR  CBC  URINALYSIS, ROUTINE W REFLEX MICROSCOPIC  GC/CHLAMYDIA PROBE AMP ([place]) NOT AT [facility]    EKG: None  Radiology: US  SCROTUM W/DOPPLER Result Date: 10/22/2023 CLINICAL DATA:  Testicular pain EXAM: SCROTAL ULTRASOUND DOPPLER ULTRASOUND OF THE TESTICLES TECHNIQUE: Complete ultrasound examination of the testicles, epididymis, and other scrotal structures was performed. Color and spectral Doppler ultrasound were also utilized to evaluate blood flow to the testicles. COMPARISON:  None Available.  FINDINGS: Right testicle Measurements: 5.4 x 2.9 x 4.0 cm. No mass or microlithiasis visualized. Left testicle Measurements: 5.1 x 2.7 x 3.8 cm. No mass or microlithiasis visualized. Right epididymis:  Normal in size and appearance. Left epididymis:  Normal in size and appearance. Hydrocele:  Left-sided hydrocele is seen. Varicocele:  None visualized. Pulsed Doppler interrogation of both testes demonstrates mild increased vascularity in the left testicle suspicious for orchitis. IMPRESSION: Increased vascularity in the left testicle which may represent focal orchitis. No other focal abnormality is noted. Electronically Signed   By: Oneil Devonshire M.D.   On: 10/22/2023 20:22     Procedures    Medications Ordered in the ED  cefTRIAXone  (ROCEPHIN ) injection 500 mg (500 mg Intramuscular Given 10/23/23 0128)  lidocaine  (PF) (XYLOCAINE ) 1 % injection 1-2.1 mL (1 mL Other Given 10/23/23 0128)  doxycycline  (VIBRA -TABS) tablet 100 mg (100 mg Oral Given 10/23/23 0128)                                    Medical Decision Making  This patient presents to the ED for concern of left-sided testicular pain, this involves an extensive number of treatment options, and is a complaint that carries with it a high risk of complications and morbidity.  The differential diagnosis includes epididymitis, torsion, orchitis, others   Co morbidities / Chronic conditions that complicate the patient evaluation  None   Additional history obtained:  Additional history obtained from EMR External records from outside source obtained and reviewed including urgent care note   Lab Tests:  I Ordered, and personally interpreted labs.  The pertinent results include: Grossly unremarkable CMP, CBC, UA, lipase   Imaging Studies ordered:  I ordered imaging studies including testicular ultrasound with Doppler I independently visualized and interpreted imaging which showed left-sided orchitis I agree with the radiologist interpretation    Problem List / ED Course / Critical interventions / Medication management   I ordered medication including Rocephin  and doxycycline  Reevaluation of the patient after these medicines showed that the patient stayed the same   Social Determinants of Health:  Patient is a daily smoker   Test / Admission - Considered:  Patient with testicular tenderness on exam, ultrasound showing signs of left-sided orchitis.  Plan to treat with Rocephin  and doxycycline .  Patient administered Rocephin  here in the emergency department.  Prescription for Doxy sent.  GC chlamydia testing pending.  Patient declined HIV and RPR testing during triage.  Patient advised that if positive  patient will need to refrain from sex until antibiotics are complete and the any current partners will also need to be treated.  Follow-up with urology as needed for testicular pain.      Final diagnoses:  Orchitis    ED Discharge Orders          Ordered    doxycycline  (VIBRA -TABS) 100 MG tablet  2 times daily        10/23/23 0120               Logan Ubaldo KATHEE DEVONNA 10/23/23 9862    Raford Lenis, MD 10/23/23 [phone]

## 2023-10-24 LAB — GC/CHLAMYDIA PROBE AMP (~~LOC~~) NOT AT ARMC
Chlamydia: NEGATIVE
Comment: NEGATIVE
Comment: NORMAL
Neisseria Gonorrhea: NEGATIVE

## 2023-10-25 ENCOUNTER — Encounter (HOSPITAL_COMMUNITY): Payer: Self-pay

## 2023-10-25 ENCOUNTER — Other Ambulatory Visit: Payer: Self-pay

## 2023-10-25 ENCOUNTER — Emergency Department (HOSPITAL_COMMUNITY)
Admission: EM | Admit: 2023-10-25 | Discharge: 2023-10-25 | Disposition: A | Discharge status: Home / Self Care | Payer: Self-pay | Attending: Emergency Medicine | Admitting: Emergency Medicine

## 2023-10-25 ENCOUNTER — Telehealth: Payer: Self-pay

## 2023-10-25 DIAGNOSIS — R197 Diarrhea, unspecified: Secondary | ICD-10-CM | POA: Insufficient documentation

## 2023-10-25 DIAGNOSIS — N452 Orchitis: Secondary | ICD-10-CM | POA: Insufficient documentation

## 2023-10-25 LAB — URINALYSIS, ROUTINE W REFLEX MICROSCOPIC
Bilirubin Urine: NEGATIVE
Glucose, UA: NEGATIVE mg/dL
Ketones, ur: 20 mg/dL — AB
Leukocytes,Ua: NEGATIVE
Nitrite: NEGATIVE
Protein, ur: NEGATIVE mg/dL
Specific Gravity, Urine: 1.024 (ref 1.005–1.030)
pH: 7 (ref 5.0–8.0)

## 2023-10-25 MED ORDER — ACETAMINOPHEN 500 MG PO TABS
1000.0000 mg | ORAL_TABLET | Freq: Once | ORAL | Status: AC
  Administered 2023-10-25: 1000 mg via ORAL
  Filled 2023-10-25: qty 2

## 2023-10-25 MED ORDER — OXYCODONE HCL 5 MG PO TABS: 5.0000 mg | ORAL_TABLET | ORAL | 0 refills | Status: AC | PRN

## 2023-10-25 MED ORDER — DOXYCYCLINE HYCLATE 100 MG PO CAPS: 100.0000 mg | ORAL_CAPSULE | Freq: Two times a day (BID) | ORAL | 0 refills | Status: AC

## 2023-10-25 MED ORDER — IBUPROFEN 400 MG PO TABS
600.0000 mg | ORAL_TABLET | Freq: Once | ORAL | Status: AC
  Administered 2023-10-25: 600 mg via ORAL
  Filled 2023-10-25: qty 1

## 2023-10-25 NOTE — Telephone Encounter (Signed)
 Patient called in and stated he does not feel any better, was seen wednesday and ordered doxycycline , which he has been taking. Wanted to know if he could be given something else. Discussed return precautions, that he would need to be seen by a provider. He does not currently have a PCP.

## 2023-10-25 NOTE — ED Provider Notes (Signed)
  EMERGENCY DEPARTMENT AT [facility] Provider Note   CSN: [id number] Arrival date & time: 10/25/23  1517     Patient presents with: Abdominal Pain and Testicle Pain   Jordan Blanchard is a 33 y.o. male self reportedly otherwise healthy presents emerged from today for evaluation of lower testicle and lower abdominal pain.  Patient denies any worsening of the pain.  Reports that the pain actually improves with Tylenol .  He is taking 500 mg of Tylenol  with his doxycycline .  Reports that the Tylenol  does improve the pain however he does have recurrence of this.  He reports that the pains worsen whenever he is walking around as he wears looser underwear and his testicles hit his legs.  He is not having any dysuria, hematuria, penile drip, or any swelling or changes to the testicles.  He is has occasional diarrhea but was present before hand.  Not having any black or bloody stools.  No fevers.  No difficulty in starting urine stream.  He is presenting today for further pain options.   Abdominal Pain Associated symptoms: no chills, no constipation, no dysuria, no fever, no hematuria, no nausea and no vomiting   Testicle Pain Associated symptoms include abdominal pain.       Prior to Admission medications   Medication Sig Start Date End Date Taking? Authorizing Provider  doxycycline  (VIBRAMYCIN ) 100 MG capsule Take 1 capsule (100 mg total) by mouth 2 (two) times daily for 7 days. 10/25/23 11/01/23 Yes Bernis Ernst, PA-C  oxyCODONE  (ROXICODONE ) 5 MG immediate release tablet Take 1 tablet (5 mg total) by mouth every 4 (four) hours as needed for severe pain (pain score 7-10). 10/25/23  Yes Bernis Ernst, PA-C  doxycycline  (VIBRA -TABS) 100 MG tablet Take 1 tablet (100 mg total) by mouth 2 (two) times daily. 10/23/23   Logan Ubaldo NOVAK, PA-C  ibuprofen  (ADVIL ) 800 MG tablet Take 1 tablet (800 mg total) by mouth 3 (three) times daily with meals. Patient not taking: Reported on 10/17/2023  08/19/19   Rolinda Rogue, MD    Allergies: Patient has no known allergies.    Review of Systems  Constitutional:  Negative for chills and fever.  Gastrointestinal:  Positive for abdominal pain. Negative for constipation, nausea and vomiting.  Genitourinary:  Positive for testicular pain. Negative for decreased urine volume, difficulty urinating, dysuria, frequency, hematuria, penile discharge, penile pain, penile swelling, scrotal swelling and urgency.    Updated Vital Signs BP 127/87 (BP Location: Right Arm)   Pulse 69   Temp 98 F (36.7 C) (Oral)   Resp 15   SpO2 100%   Physical Exam Vitals and nursing note reviewed. Exam conducted with a chaperone present Virgie, EMT-P).  Constitutional:      General: He is not in acute distress.    Appearance: He is not ill-appearing or toxic-appearing.  Eyes:     General: No scleral icterus. Cardiovascular:     Rate and Rhythm: Normal rate.  Pulmonary:     Effort: Pulmonary effort is normal. No respiratory distress.  Abdominal:     General: Abdomen is flat. Bowel sounds are normal. There is no distension.     Palpations: Abdomen is soft.     Tenderness: There is no abdominal tenderness.  Genitourinary:    Penis: Normal.      Testes:        Right: Mass, tenderness or swelling not present.        Left: Tenderness present. Mass or swelling not present.  Comments: Very minimal tenderness to the left testicle in the very send the right.  Testicles appear symmetric with normal lie.  No overlying skin changes noted to the scrotum.  No penile drip noted. Skin:    General: Skin is warm and dry.  Neurological:     Mental Status: He is alert.     (all labs ordered are listed, but only abnormal results are displayed) Labs Reviewed  URINALYSIS, ROUTINE W REFLEX MICROSCOPIC - Abnormal; Notable for the following components:      Result Value   APPearance HAZY (*)    Hgb urine dipstick SMALL (*)    Ketones, ur 20 (*)    Bacteria, UA RARE  (*)    All other components within normal limits  URINE CULTURE    EKG: None  Radiology: No results found.  Procedures   Medications Ordered in the ED  acetaminophen  (TYLENOL ) tablet 1,000 mg (1,000 mg Oral Given 10/25/23 1705)  ibuprofen  (ADVIL ) tablet 600 mg (600 mg Oral Given 10/25/23 1705)   Medical Decision Making Amount and/or Complexity of Data Reviewed Labs: ordered.  Risk OTC drugs. Prescription drug management.   33 y.o. male presents to the ER for evaluation of lower abdomen and left testicle pain, unchanged. Differential diagnosis includes but is not limited to testicular torsion, epididymitis, orchitis, STD, UTI. Vital signs unremarkable. Physical exam as noted above.   On previous chart evaluation, patient was evaluated on 10-22-2023 where he had a ultrasound of his testicle that showed orchitis without torsion.  He was given doxycycline  however was only given 1 week.  I independently reviewed and interpreted the patient's labs.  Urinalysis shows hazy urine as well with a hemoglobin.  20 ketones present.  6-10 red blood cells with rare bacteria but no white blood cells, leukocytes, or nitrites. Urine culture ordered.   The patient reports resolution of pain with the medications.  He does not appear in any acute distress.  I do not think the patient has testicular torsion.  I think he is likely spearing seeing some pain still given that he is not wearing supportive underwear and is taking suboptimal pain medication.  With pain medication here, he had resolution of pain.  Recommend the patient have tight underwear for compression to help with the symptoms as well as icing the area.  I do not feel patient needs repeat ultrasound imaging.  Abdomen is soft and nontender.  I have extended the patient's doxycycline  for 14 days and so the original 7 days.  I have also sent the patient home with some narcotic pain medication as well. He is not having any urinary symptoms, but I will  culture the urine as well. His G/C was negative from previous chart review.   We discussed the results of the labs/imaging. The plan is taking antibiotics, supportive care. We discussed strict return precautions and red flag symptoms. The patient verbalized their understanding and agrees to the plan. The patient is stable and being discharged home in good condition.  I discussed this case with my attending physician who cosigned this note including patient's presenting symptoms, physical exam, and planned diagnostics and interventions. Attending physician stated agreement with plan or made changes to plan which were implemented.   Portions of this report may have been transcribed using voice recognition software. Every effort was made to ensure accuracy; however, inadvertent computerized transcription errors may be present.    Final diagnoses:  Orchitis    ED Discharge Orders  Ordered    doxycycline  (VIBRAMYCIN ) 100 MG capsule  2 times daily        10/25/23 1853    oxyCODONE  (ROXICODONE ) 5 MG immediate release tablet  Every 4 hours PRN        10/25/23 1853               Bernis Ernst, PA-C 10/26/23 0159    Dasie Faden, MD 10/26/23 [phone]

## 2023-10-25 NOTE — ED Notes (Signed)
 Pt states he just urinated.. says give me 15 minutes and I can do it again

## 2023-10-25 NOTE — Discharge Instructions (Addendum)
 You were seen in the ER today for evaluation of your symptoms.  You will need to continue to stay on the antibiotics.  For pain, recommend taking 600 mg of ibuprofen  and 1000mg  of Tylenol  every 6 hours as needed for pain.  I have sent you in a few doses of narcotic pain medication to help for breakthrough pain.  Please do not drive or operate heavy machinery while on this medication as it will make you sleepy.  Additionally, you will need to be on doxycycline  for an additional week afterwards.  Please continue this course after you have finished the previous 1.  Additionally its wearing tighter underwear can help with your symptoms and provide support.  I have included the information for urologist for you to follow-up with.  Please make sure you call to schedule an appointment.  If you have any concerns or any worsening symptoms, please return the nearest emerged part and for reevaluation.  Please review the attached information  Contact a health care provider if: You have a fever. Pain and swelling have not gotten better after 3 days. Get help right away if: Your pain is getting worse. The swelling in your testicle gets worse.

## 2023-10-25 NOTE — ED Triage Notes (Signed)
 Pt c.o continued abd pain and testicular pain since he was seen and treated on 8/27, pt still taking abx. No n/v/d

## 2023-10-26 LAB — URINE CULTURE: Culture: <10000 — AB
